# Patient Record
Sex: Female | Born: 2000 | Race: Black or African American | Hispanic: No | Marital: Single | Smoking: Never smoker
Health system: Southern US, Community
[De-identification: ages and names within clinical notes are randomized; demographics above are authoritative.]

## PROBLEM LIST (undated history)

## (undated) ENCOUNTER — Ambulatory Visit

## (undated) ENCOUNTER — Inpatient Hospital Stay (HOSPITAL_COMMUNITY): Payer: Self-pay

## (undated) DIAGNOSIS — A749 Chlamydial infection, unspecified: Secondary | ICD-10-CM

## (undated) HISTORY — PX: NO PAST SURGERIES: SHX2092

---
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
## 2022-01-01 DIAGNOSIS — Z3046 Encounter for surveillance of implantable subdermal contraceptive: Secondary | ICD-10-CM | POA: Diagnosis not present

## 2022-03-15 ENCOUNTER — Ambulatory Visit (HOSPITAL_COMMUNITY)
Admission: EM | Admit: 2022-03-15 | Discharge: 2022-03-15 | Disposition: A | Discharge status: Home / Self Care | Payer: Medicaid Other | Attending: Emergency Medicine | Admitting: Emergency Medicine

## 2022-03-15 ENCOUNTER — Encounter (HOSPITAL_COMMUNITY): Payer: Self-pay

## 2022-03-15 DIAGNOSIS — N390 Urinary tract infection, site not specified: Secondary | ICD-10-CM | POA: Diagnosis not present

## 2022-03-15 DIAGNOSIS — Z113 Encounter for screening for infections with a predominantly sexual mode of transmission: Secondary | ICD-10-CM | POA: Diagnosis not present

## 2022-03-15 DIAGNOSIS — N898 Other specified noninflammatory disorders of vagina: Secondary | ICD-10-CM | POA: Diagnosis not present

## 2022-03-15 LAB — POCT URINALYSIS DIPSTICK, ED / UC
Bilirubin Urine: NEGATIVE
Glucose, UA: NEGATIVE mg/dL
Hgb urine dipstick: NEGATIVE
Nitrite: POSITIVE — AB
Protein, ur: NEGATIVE mg/dL
Specific Gravity, Urine: 1.025 (ref 1.005–1.030)
Urobilinogen, UA: 1 mg/dL (ref 0.0–1.0)
pH: 6.5 (ref 5.0–8.0)

## 2022-03-15 LAB — POC URINE PREG, ED: Preg Test, Ur: NEGATIVE

## 2022-03-15 MED ORDER — CIPROFLOXACIN HCL 250 MG PO TABS: 250.0000 mg | ORAL_TABLET | Freq: Two times a day (BID) | ORAL | 0 refills | Status: AC

## 2022-03-15 MED ORDER — CIPROFLOXACIN HCL 250 MG PO TABS: 250.0000 mg | ORAL_TABLET | Freq: Two times a day (BID) | ORAL | 0 refills | Status: DC

## 2022-03-15 NOTE — ED Provider Notes (Signed)
[facility]    CSN: [id number] Arrival date & time: 03/15/22  1639    HISTORY   Chief Complaint  Patient presents with   Vaginal Discharge   Dysuria   HPI Caitlin King is a pleasant, 21 y.o. female who presents to urgent care today. Patient having tingling sensation when she urinates. Patient having yellow vaginal discharge.  Patient denies lower back pain, flank pain, abdominal pain, fever, aches, chills, known STD exposure but endorses having unprotected sex 2 days ago.  The history is provided by the patient.   Past Medical History:  Diagnosis Date   Hx of chlamydia infection 01/2020   Hx of ectopic pregnancy 01/2020   Hx of gonorrhea 01/2020   Patient Active Problem List   Diagnosis Date Noted   S/P C-section 12/20/2020   Impacted cerumen of left ear 10/04/2020   Gonorrhea affecting pregnancy 05/18/2020   Chlamydia infection during pregnancy 05/18/2020   Ectopic pregnancy of left ovary 02/16/2020   Bleeding in early pregnancy 02/16/2020   Past Surgical History:  Procedure Laterality Date   CESAREAN SECTION N/A 12/20/2020   Procedure: CESAREAN SECTION;  Surgeon: Rande Brunt, MD;  Location: [facility];  Service: Obstetrics;  Laterality: N/A;   OB History     Gravida  2   Para  1   Term  1   Preterm      AB  1   Living  1      SAB      IAB      Ectopic  1   Multiple  0   Live Births  1        Obstetric Comments  G1= ectopic 01/2020, MTX, didn't f/u        Home Medications    Prior to Admission medications   Not on File    Family History Family History  Problem Relation Age of Onset   Healthy Mother    Healthy Father    Asthma Brother    Alcohol abuse Neg Hx    Arthritis Neg Hx    Birth defects Neg Hx    Cancer Neg Hx    Depression Neg Hx    COPD Neg Hx    Diabetes Neg Hx    Drug abuse Neg Hx    Early death Neg Hx    Hearing loss Neg Hx    Heart disease Neg Hx    Hyperlipidemia Neg Hx     Hypertension Neg Hx    Learning disabilities Neg Hx    Kidney disease Neg Hx    Mental illness Neg Hx    Mental retardation Neg Hx    Miscarriages / Stillbirths Neg Hx    Stroke Neg Hx    Vision loss Neg Hx    Varicose Veins Neg Hx    Social History Social History   Tobacco Use   Smoking status: Never   Smokeless tobacco: Never  Vaping Use   Vaping Use: Never used  Substance Use Topics   Alcohol use: Not Currently   Drug use: Never   Allergies   Patient has no known allergies.  Review of Systems Review of Systems Pertinent findings revealed after performing a 14 point review of systems has been noted in the history of present illness.  Physical Exam Triage Vital Signs ED Triage Vitals  Enc Vitals Group     BP 05/24/21 0827 (!) 147/82     Pulse Rate 05/24/21 0827 72  Resp 05/24/21 0827 18     Temp 05/24/21 0827 98.3 F (36.8 C)     Temp Source 05/24/21 0827 Oral     SpO2 05/24/21 0827 98 %     Weight --      Height --      Head Circumference --      Peak Flow --      Pain Score 05/24/21 0826 5     Pain Loc --      Pain Edu? --      Excl. in GC? --   No data found.  Updated Vital Signs BP 106/65 (BP Location: Right Arm)   Pulse 77   Temp 98.2 F (36.8 C) (Oral)   Resp 16   Ht 5\' 5"  (1.651 m)   Wt 147 lb (66.7 kg)   LMP 02/25/2022 (Exact Date)   SpO2 100%   Breastfeeding No   BMI 24.46 kg/m   Physical Exam Vitals and nursing note reviewed.  Constitutional:      General: She is not in acute distress.    Appearance: Normal appearance. She is not ill-appearing.  HENT:     Head: Normocephalic and atraumatic.  Eyes:     General: Lids are normal.        Right eye: No discharge.        Left eye: No discharge.     Extraocular Movements: Extraocular movements intact.     Conjunctiva/sclera: Conjunctivae normal.     Right eye: Right conjunctiva is not injected.     Left eye: Left conjunctiva is not injected.  Neck:     Trachea: Trachea and  phonation normal.  Cardiovascular:     Rate and Rhythm: Normal rate and regular rhythm.     Pulses: Normal pulses.     Heart sounds: Normal heart sounds. No murmur heard.    No friction rub. No gallop.  Pulmonary:     Effort: Pulmonary effort is normal. No accessory muscle usage, prolonged expiration or respiratory distress.     Breath sounds: Normal breath sounds. No stridor, decreased air movement or transmitted upper airway sounds. No decreased breath sounds, wheezing, rhonchi or rales.  Chest:     Chest wall: No tenderness.  Abdominal:     General: Abdomen is flat. Bowel sounds are normal.     Palpations: Abdomen is soft.  Genitourinary:    Comments: Patient politely declines pelvic exam today, patient provided a vaginal swab for testing. Musculoskeletal:        General: Normal range of motion.     Cervical back: Normal range of motion and neck supple. Normal range of motion.  Lymphadenopathy:     Cervical: No cervical adenopathy.  Skin:    General: Skin is warm and dry.     Findings: No erythema or rash.  Neurological:     General: No focal deficit present.     Mental Status: She is alert and oriented to person, place, and time.  Psychiatric:        Mood and Affect: Mood normal.        Behavior: Behavior normal.     Visual Acuity Right Eye Distance:   Left Eye Distance:   Bilateral Distance:    Right Eye Near:   Left Eye Near:    Bilateral Near:     UC Couse / Diagnostics / Procedures:     Radiology No results found.  Procedures Procedures (including critical care time) EKG  Pending results:  Labs  Reviewed  POCT URINALYSIS DIPSTICK, ED / UC - Abnormal; Notable for the following components:      Result Value   Ketones, ur TRACE (*)    Nitrite POSITIVE (*)    Leukocytes,Ua MODERATE (*)    All other components within normal limits  URINE CULTURE  POCT URINE PREGNANCY  POC URINE PREG, ED  CERVICOVAGINAL ANCILLARY ONLY    Medications Ordered in  UC: Medications - No data to display  UC Diagnoses / Final Clinical Impressions(s)   I have reviewed the triage vital signs and the nursing notes.  Pertinent labs & imaging results that were available during my care of the patient were reviewed by me and considered in my medical decision making (see chart for details).    Final diagnoses:  Lower urinary tract infectious disease  Problematic vaginal discharge  Screening examination for STD (sexually transmitted disease)    STD screening was performed, patient advised that the results be posted to their MyChart and if any of the results are positive, they will be notified by phone, further treatment will be provided as indicated based on results of STD screening. Patient was advised to abstain from sexual intercourse until that they receive the results of their STD testing.  Patient was also advised to use condoms to protect themselves from STD exposure. Urine pregnancy test was negative. Return precautions advised.  Drug allergies reviewed, all questions addressed.   Patient was advised to begin antibiotics now due to findings on urine dip. Patient advised that they will be contacted with results and that adjustments to treatment will be provided as indicated based on the results.   Patient was advised of possibility that urine culture results may be negative if sample provided was obtained late in the day causing urine to be more diluted.  Patient was advised that if antibiotics were effective after the first 24 to 36 hours, despite negative urine culture result, it is recommended that they complete the full course as prescribed.   Return precautions advised.  ED Prescriptions     Medication Sig Dispense Auth. Provider   ciprofloxacin (CIPRO) 250 MG tablet  (Status: Discontinued) Take 1 tablet (250 mg total) by mouth 2 (two) times daily for 5 days. 10 tablet Theadora Rama Scales, PA-C   ciprofloxacin (CIPRO) 250 MG tablet Take 1 tablet  (250 mg total) by mouth 2 (two) times daily for 5 days. 10 tablet Theadora Rama Scales, PA-C      PDMP not reviewed this encounter.  Disposition Upon Discharge:  Condition: stable for discharge home  Patient presented with concern for an acute illness with associated systemic symptoms and significant discomfort requiring urgent management. In my opinion, this is a condition that a prudent lay person (someone who possesses an average knowledge of health and medicine) may potentially expect to result in complications if not addressed urgently such as respiratory distress, impairment of bodily function or dysfunction of bodily organs.   As such, the patient has been evaluated and assessed, work-up was performed and treatment was provided in alignment with urgent care protocols and evidence based medicine.  Patient/parent/caregiver has been advised that the patient may require follow up for further testing and/or treatment if the symptoms continue in spite of treatment, as clinically indicated and appropriate.  Routine symptom specific, illness specific and/or disease specific instructions were discussed with the patient and/or caregiver at length.  Prevention strategies for avoiding STD exposure were also discussed.  The patient will follow up with their current  PCP if and as advised. If the patient does not currently have a PCP we will assist them in obtaining one.   The patient may need specialty follow up if the symptoms continue, in spite of conservative treatment and management, for further workup, evaluation, consultation and treatment as clinically indicated and appropriate.  Patient/parent/caregiver verbalized understanding and agreement of plan as discussed.  All questions were addressed during visit.  Please see discharge instructions below for further details of plan.  Discharge Instructions:   Discharge Instructions      The urinalysis that we performed in the clinic today was  abnormal.  Urine culture will be performed per our protocol.  The result of the urine culture will be available in the next 3 to 5 days and will be posted to your MyChart account.  If there is an abnormal finding, you will be contacted by phone and advised of further treatment recommendations, if any.   You were advised to begin antibiotics today because your urinalysis is concerning for an acute lower urinary tract infection also known as cystitis.  It is very important that you take all doses exactly as prescribed.  Incomplete antibiotic therapy can cause worsening urinary tract infection that can become aggressive, escape from urinary tract into your bloodstream causing sepsis which will require hospitalization.   Please pick up and begin taking your prescription for Ciprofloxacin 250 mg as soon as possible.  Please take all doses exactly as prescribed.  You can take this medication with or without food.  This medication is safe to take with your other medications.   The results of your vaginal swab test which screens for BV, yeast, gonorrhea, chlamydia and trichomonas will be made available to you once it is complete.  This typically takes 3 to 5 days.  Please note that we do not test for herpes virus unless you are having an active lesion concerning for herpes outbreak.  Please abstain from sexual intercourse of any kind, vaginal, oral or anal, until you have received the results of your STD testing.     Your urine pregnancy test today is negative.   If you have not had complete resolution of your symptoms after completing treatment, please return for repeat evaluation.   Thank you for visiting urgent care today.  I appreciate the opportunity to participate in your care.        This office note has been dictated using Teaching laboratory technician.  Unfortunately, this method of dictation can sometimes lead to typographical or grammatical errors.  I apologize for your inconvenience in  advance if this occurs.  Please do not hesitate to reach out to me if clarification is needed.       Theadora Rama Scales, [place] 03/15/22 [phone]

## 2022-03-15 NOTE — ED Triage Notes (Signed)
Patient having tingling sensation when she urinates. Patient having yellow vaginal discharge.  No low back or abdominal pain.

## 2022-03-15 NOTE — Discharge Instructions (Addendum)
The urinalysis that we performed in the clinic today was abnormal.  Urine culture will be performed per our protocol.  The result of the urine culture will be available in the next 3 to 5 days and will be posted to your MyChart account.  If there is an abnormal finding, you will be contacted by phone and advised of further treatment recommendations, if any.   You were advised to begin antibiotics today because your urinalysis is concerning for an acute lower urinary tract infection also known as cystitis.  It is very important that you take all doses exactly as prescribed.  Incomplete antibiotic therapy can cause worsening urinary tract infection that can become aggressive, escape from urinary tract into your bloodstream causing sepsis which will require hospitalization.   Please pick up and begin taking your prescription for Ciprofloxacin 250 mg as soon as possible.  Please take all doses exactly as prescribed.  You can take this medication with or without food.  This medication is safe to take with your other medications.   The results of your vaginal swab test which screens for BV, yeast, gonorrhea, chlamydia and trichomonas will be made available to you once it is complete.  This typically takes 3 to 5 days.  Please note that we do not test for herpes virus unless you are having an active lesion concerning for herpes outbreak.  Please abstain from sexual intercourse of any kind, vaginal, oral or anal, until you have received the results of your STD testing.     Your urine pregnancy test today is negative.   If you have not had complete resolution of your symptoms after completing treatment, please return for repeat evaluation.   Thank you for visiting urgent care today.  I appreciate the opportunity to participate in your care.

## 2022-03-17 ENCOUNTER — Telehealth: Payer: Medicaid Other | Admitting: Physician Assistant

## 2022-03-17 DIAGNOSIS — N898 Other specified noninflammatory disorders of vagina: Secondary | ICD-10-CM

## 2022-03-17 LAB — CERVICOVAGINAL ANCILLARY ONLY
Bacterial Vaginitis (gardnerella): POSITIVE — AB
Candida Glabrata: NEGATIVE
Candida Vaginitis: POSITIVE — AB
Chlamydia: POSITIVE — AB
Comment: NEGATIVE
Comment: NEGATIVE
Comment: NEGATIVE
Comment: NEGATIVE
Comment: NEGATIVE
Comment: NORMAL
Neisseria Gonorrhea: POSITIVE — AB
Trichomonas: POSITIVE — AB

## 2022-03-17 LAB — URINE CULTURE: Culture: 70000 — AB

## 2022-03-17 NOTE — Progress Notes (Signed)
Patient never responded to question asked prior to end of shift.

## 2022-03-18 ENCOUNTER — Telehealth (HOSPITAL_COMMUNITY): Payer: Self-pay | Admitting: Emergency Medicine

## 2022-03-18 MED ORDER — DOXYCYCLINE HYCLATE 100 MG PO CAPS: 100.0000 mg | ORAL_CAPSULE | Freq: Two times a day (BID) | ORAL | 0 refills | Status: AC

## 2022-03-18 MED ORDER — METRONIDAZOLE 500 MG PO TABS: 500.0000 mg | ORAL_TABLET | Freq: Two times a day (BID) | ORAL | 0 refills | Status: DC

## 2022-03-18 MED ORDER — FLUCONAZOLE 150 MG PO TABS: 150.0000 mg | ORAL_TABLET | Freq: Once | ORAL | 0 refills | Status: AC

## 2022-06-04 ENCOUNTER — Ambulatory Visit
Admission: EM | Admit: 2022-06-04 | Discharge: 2022-06-04 | Disposition: A | Discharge status: Home / Self Care | Payer: Medicaid Other | Attending: Emergency Medicine | Admitting: Emergency Medicine

## 2022-06-04 DIAGNOSIS — J069 Acute upper respiratory infection, unspecified: Secondary | ICD-10-CM | POA: Diagnosis not present

## 2022-06-04 DIAGNOSIS — B9789 Other viral agents as the cause of diseases classified elsewhere: Secondary | ICD-10-CM

## 2022-06-04 DIAGNOSIS — J988 Other specified respiratory disorders: Secondary | ICD-10-CM | POA: Diagnosis not present

## 2022-06-04 DIAGNOSIS — Z1152 Encounter for screening for COVID-19: Secondary | ICD-10-CM | POA: Diagnosis not present

## 2022-06-04 DIAGNOSIS — Z113 Encounter for screening for infections with a predominantly sexual mode of transmission: Secondary | ICD-10-CM

## 2022-06-04 LAB — RESP PANEL BY RT-PCR (FLU A&B, COVID) ARPGX2
Influenza A by PCR: NEGATIVE
Influenza B by PCR: NEGATIVE
SARS Coronavirus 2 by RT PCR: NEGATIVE

## 2022-06-04 LAB — POCT URINALYSIS DIP (MANUAL ENTRY)
Bilirubin, UA: NEGATIVE
Glucose, UA: NEGATIVE mg/dL
Leukocytes, UA: NEGATIVE
Nitrite, UA: NEGATIVE
Protein Ur, POC: NEGATIVE mg/dL
Spec Grav, UA: >=1.03 — AB (ref 1.010–1.025)
Urobilinogen, UA: 0.2 E.U./dL
pH, UA: 6 (ref 5.0–8.0)

## 2022-06-04 LAB — POCT URINE PREGNANCY
Preg Test, Ur: NEGATIVE
Preg Test, Ur: NEGATIVE

## 2022-06-04 MED ORDER — IBUPROFEN 400 MG PO TABS: 400.0000 mg | ORAL_TABLET | Freq: Three times a day (TID) | ORAL | 0 refills | Status: DC | PRN

## 2022-06-04 MED ORDER — PROMETHAZINE-DM 6.25-15 MG/5ML PO SYRP: 5.0000 mL | ORAL_SOLUTION | Freq: Four times a day (QID) | ORAL | 0 refills | Status: DC | PRN

## 2022-06-04 MED ORDER — IPRATROPIUM BROMIDE 0.06 % NA SOLN: 2.0000 | Freq: Three times a day (TID) | NASAL | 1 refills | Status: DC

## 2022-06-04 MED ORDER — GUAIFENESIN 400 MG PO TABS: ORAL_TABLET | ORAL | 0 refills | Status: AC

## 2022-06-04 NOTE — Discharge Instructions (Addendum)
The results of your vaginal swab test which screens for HIV, syphilis, BV, yeast, gonorrhea, chlamydia and trichomonas will be made posted to your MyChart account once it is complete.  This typically takes 2 to 4 days.  Please abstain from sexual intercourse of any kind, vaginal, oral or anal, until you have received the results of your STD testing.     If any of your results are abnormal, you will receive a phone call regarding treatment.  Prescriptions, if any are needed, will be provided for you at your pharmacy.     Your urine pregnancy test today is negative.   Our point-of-care analysis of your urine sample today was normal and did not reveal any concern for urinary tract infection.   Please read below to learn more about the medications, dosages and frequencies that I recommend to help alleviate your symptoms and to get you feeling better soon:   Atrovent (ipratropium): This is an excellent nasal decongestant spray that does not cause rebound congestion, please instill 2 sprays into each nare with each use.  Please use the spray up to 4 times daily as needed.  I have provided you with a prescription for this medication.      Advil, Motrin (ibuprofen): This is a good anti-inflammatory medication which not only addresses aches, pains but also significantly reduces soft tissue inflammation of the upper airways that causes sinus and nasal congestion as well as inflammation of the lower airways which makes you feel like your breathing is constricted or your cough feel tight.  I recommend that you take 400 mg every 8 hours as needed.      Robitussin, Mucinex (guaifenesin): This is an expectorant.  This helps break up chest congestion and loosen up thick nasal drainage making phlegm and drainage more liquid and therefore easier to remove.  I recommend being 400 mg three times daily as needed.      Promethazine DM: Promethazine is both a nasal decongestant and an antinausea medication that makes most  patients feel fairly sleepy.  The DM is dextromethorphan, a cough suppressant found in many over-the-counter cough medications.  Please take 5 mL before bedtime to minimize your cough which will help you sleep better.  I have sent a prescription for this medication to your pharmacy.   Please follow-up within the next 5-7 days either with your primary care provider or urgent care if your symptoms do not resolve.  If you do not have a primary care provider, we will assist you in finding one.        Thank you for visiting urgent care today.  We appreciate the opportunity to participate in your care.

## 2022-06-04 NOTE — ED Triage Notes (Addendum)
Cough that started 2 days ago with a headache and nasal congestion. Has lower back and abdomen pain. Not taking any OTC medication. Would like STD and UTI check.

## 2022-06-04 NOTE — ED Provider Notes (Signed)
[facility]    CSN: [id number] Arrival date & time: 06/04/22  1456    HISTORY   Chief Complaint  Patient presents with   Cough   Headache   Back Pain    Lower back   HPI Caitlin King is a pleasant, 21 y.o. female who presents to urgent care today. Patient complains of a 2-day history of cough with headache and nasal congestion.  Patient also complains of lower back and abdominal pain.  Patient states has not tried any medications to alleviate her symptoms.  Patient tested positive for chlamydia, gonorrhea, trichomonas, BV and yeast in August 2023, urine culture at that time was also positive for 70 CFU's of Klebsiella.  Patient is also requesting screening for STDs and urinary tract infection.    The history is provided by the patient.   Past Medical History:  Diagnosis Date   Hx of chlamydia infection 01/2020   Hx of ectopic pregnancy 01/2020   Hx of gonorrhea 01/2020   Patient Active Problem List   Diagnosis Date Noted   S/P C-section 12/20/2020   Impacted cerumen of left ear 10/04/2020   Gonorrhea affecting pregnancy 05/18/2020   Chlamydia infection during pregnancy 05/18/2020   Ectopic pregnancy of left ovary 02/16/2020   Bleeding in early pregnancy 02/16/2020   Past Surgical History:  Procedure Laterality Date   CESAREAN SECTION N/A 12/20/2020   Procedure: CESAREAN SECTION;  Surgeon: Jonelle Sidle, MD;  Location: [facility];  Service: Obstetrics;  Laterality: N/A;   OB History     Gravida  2   Para  1   Term  1   Preterm      AB  1   Living  1      SAB      IAB      Ectopic  1   Multiple  0   Live Births  1        Obstetric Comments  G1= ectopic 01/2020, MTX, didn't f/u        Home Medications    Prior to Admission medications   Not on File    Family History Family History  Problem Relation Age of Onset   Healthy Mother    Healthy Father    Asthma Brother    Alcohol abuse Neg Hx    Arthritis Neg Hx     Birth defects Neg Hx    Cancer Neg Hx    Depression Neg Hx    COPD Neg Hx    Diabetes Neg Hx    Drug abuse Neg Hx    Early death Neg Hx    Hearing loss Neg Hx    Heart disease Neg Hx    Hyperlipidemia Neg Hx    Hypertension Neg Hx    Learning disabilities Neg Hx    Kidney disease Neg Hx    Mental illness Neg Hx    Mental retardation Neg Hx    Miscarriages / Stillbirths Neg Hx    Stroke Neg Hx    Vision loss Neg Hx    Varicose Veins Neg Hx    Social History Social History   Tobacco Use   Smoking status: Never   Smokeless tobacco: Never  Vaping Use   Vaping Use: Never used  Substance Use Topics   Alcohol use: Not Currently   Drug use: Never   Allergies   Patient has no known allergies.  Review of Systems Review of Systems Pertinent findings revealed after performing  a 14 point review of systems has been noted in the history of present illness.  Physical Exam Triage Vital Signs ED Triage Vitals  Enc Vitals Group     BP 05/24/21 0827 (!) 147/82     Pulse Rate 05/24/21 0827 72     Resp 05/24/21 0827 18     Temp 05/24/21 0827 98.3 F (36.8 C)     Temp Source 05/24/21 0827 Oral     SpO2 05/24/21 0827 98 %     Weight --      Height --      Head Circumference --      Peak Flow --      Pain Score 05/24/21 0826 5     Pain Loc --      Pain Edu? --      Excl. in [place]? --   No data found.  Updated Vital Signs BP 121/76 (BP Location: Right Arm)   Pulse (!) 106   Temp 98.1 F (36.7 C) (Oral)   Resp 16   LMP 05/31/2022 Comment: very light  SpO2 96%   Physical Exam Vitals and nursing note reviewed.  Constitutional:      General: She is not in acute distress.    Appearance: Normal appearance. She is ill-appearing.  HENT:     Head: Normocephalic and atraumatic.     Salivary Glands: Right salivary gland is not diffusely enlarged or tender. Left salivary gland is not diffusely enlarged or tender.     Right Ear: Tympanic membrane, ear canal and external ear normal.  No drainage. No middle ear effusion. There is no impacted cerumen. Tympanic membrane is not erythematous or bulging.     Left Ear: Tympanic membrane, ear canal and external ear normal. No drainage.  No middle ear effusion. There is no impacted cerumen. Tympanic membrane is not erythematous or bulging.     Nose: Congestion and rhinorrhea present. No nasal deformity, septal deviation or mucosal edema. Rhinorrhea is clear.     Right Turbinates: Not enlarged, swollen or pale.     Left Turbinates: Not enlarged, swollen or pale.     Right Sinus: No maxillary sinus tenderness or frontal sinus tenderness.     Left Sinus: No maxillary sinus tenderness or frontal sinus tenderness.     Mouth/Throat:     Lips: Pink. No lesions.     Mouth: Mucous membranes are moist. No oral lesions.     Pharynx: Uvula midline. Pharyngeal swelling, posterior oropharyngeal erythema and uvula swelling present.     Tonsils: No tonsillar exudate. 0 on the right. 0 on the left.  Eyes:     General: Lids are normal.        Right eye: No discharge.        Left eye: No discharge.     Extraocular Movements: Extraocular movements intact.     Conjunctiva/sclera: Conjunctivae normal.     Right eye: Right conjunctiva is not injected.     Left eye: Left conjunctiva is not injected.  Neck:     Trachea: Trachea and phonation normal.  Cardiovascular:     Rate and Rhythm: Normal rate and regular rhythm.     Pulses: Normal pulses.     Heart sounds: Normal heart sounds. No murmur heard.    No friction rub. No gallop.  Pulmonary:     Effort: Pulmonary effort is normal. No accessory muscle usage, prolonged expiration or respiratory distress.     Breath sounds: Normal breath sounds. No stridor,  decreased air movement or transmitted upper airway sounds. No decreased breath sounds, wheezing, rhonchi or rales.     Comments: Turbulent breath sounds throughout without wheeze, rale, rhonchi. Chest:     Chest wall: No tenderness.  Abdominal:      General: Abdomen is flat. Bowel sounds are normal.     Palpations: Abdomen is soft.  Genitourinary:    Comments: Patient politely declines pelvic exam today, patient provided a vaginal swab for testing. Musculoskeletal:        General: Normal range of motion.     Cervical back: Normal range of motion and neck supple. Normal range of motion.  Lymphadenopathy:     Cervical: Cervical adenopathy present.     Right cervical: Superficial cervical adenopathy and posterior cervical adenopathy present.     Left cervical: Superficial cervical adenopathy and posterior cervical adenopathy present.  Skin:    General: Skin is warm and dry.     Findings: No erythema or rash.  Neurological:     General: No focal deficit present.     Mental Status: She is alert and oriented to person, place, and time.     Motor: Motor function is intact.     Coordination: Coordination is intact.     Gait: Gait is intact.     Deep Tendon Reflexes: Reflexes are normal and symmetric.  Psychiatric:        Attention and Perception: Attention and perception normal.        Mood and Affect: Mood and affect normal.        Speech: Speech normal.        Behavior: Behavior normal. Behavior is cooperative.        Thought Content: Thought content normal.     Visual Acuity Right Eye Distance:   Left Eye Distance:   Bilateral Distance:    Right Eye Near:   Left Eye Near:    Bilateral Near:     [facility] / Diagnostics / Procedures:     Radiology No results found.  Procedures Procedures (including critical care time) EKG  Pending results:  Labs Reviewed  POCT URINALYSIS DIP (MANUAL ENTRY) - Abnormal; Notable for the following components:      Result Value   Ketones, POC UA trace (5) (*)    Spec Grav, UA >=1.030 (*)    Blood, UA large (*)    All other components within normal limits  RESP PANEL BY RT-PCR (FLU A&B, COVID) ARPGX2  RPR  HIV ANTIBODY (ROUTINE TESTING W REFLEX)  POCT URINE PREGNANCY   CERVICOVAGINAL ANCILLARY ONLY    Medications Ordered in UC: Medications - No data to display  UC Diagnoses / Final Clinical Impressions(s)   I have reviewed the triage vital signs and the nursing notes.  Pertinent labs & imaging results that were available during my care of the patient were reviewed by me and considered in my medical decision making (see chart for details).    Final diagnoses:  Screening examination for STD (sexually transmitted disease)  Acute upper respiratory infection  Viral respiratory infection    STD screening was performed, patient advised that the results be posted to their MyChart and if any of the results are positive, they will be notified by phone, further treatment will be provided as indicated based on results of STD screening. Patient was advised to abstain from sexual intercourse until that they receive the results of their STD testing.  Patient was also advised to use condoms to protect themselves  from STD exposure. Urinalysis today was normal. Urine pregnancy test was negative.  Rapid flu test today is negative.  Patient reports negative home COVID-19 test this morning.  Patient was provided with Atrovent nasal spray to dry up nasal secretions to reduce postnasal drip which is the likely source of her cough at this time.  Patient provided with Promethazine DM for nighttime cough and Robitussin for daytime cough.  Patient advised to take ibuprofen 400 mg 3 times daily for inflammation.  COVID-19 and influenza PCR tests pending, results to be posted to patient's MyChart, due to patient's age and lack of comorbidities, would not qualify for Paxlovid.  Due to duration of symptoms, would not benefit from Tamiflu.  ED Prescriptions     Medication Sig Dispense Auth. Provider   ipratropium (ATROVENT) 0.06 % nasal spray Place 2 sprays into both nostrils 3 (three) times daily. As needed for nasal congestion, runny nose 15 mL Lynden Oxford Scales, PA-C    guaifenesin (HUMIBID E) 400 MG TABS tablet Take 1 tablet 3 times daily as needed for chest congestion and cough 21 tablet Lynden Oxford Scales, PA-C   promethazine-dextromethorphan (PROMETHAZINE-DM) 6.25-15 MG/5ML syrup Take 5 mLs by mouth 4 (four) times daily as needed for cough. 118 mL Lynden Oxford Scales, PA-C   ibuprofen (ADVIL) 400 MG tablet Take 1 tablet (400 mg total) by mouth every 8 (eight) hours as needed for up to 30 doses. 30 tablet Lynden Oxford Scales, PA-C      PDMP not reviewed this encounter.  Disposition Upon Discharge:  Condition: stable for discharge home  Patient presented with concern for an acute illness with associated systemic symptoms and significant discomfort requiring urgent management. In my opinion, this is a condition that a prudent lay person (someone who possesses an average knowledge of health and medicine) may potentially expect to result in complications if not addressed urgently such as respiratory distress, impairment of bodily function or dysfunction of bodily organs.   As such, the patient has been evaluated and assessed, work-up was performed and treatment was provided in alignment with urgent care protocols and evidence based medicine.  Patient/parent/caregiver has been advised that the patient may require follow up for further testing and/or treatment if the symptoms continue in spite of treatment, as clinically indicated and appropriate.  Routine symptom specific, illness specific and/or disease specific instructions were discussed with the patient and/or caregiver at length.  Prevention strategies for avoiding STD exposure were also discussed.  The patient will follow up with their current PCP if and as advised. If the patient does not currently have a PCP we will assist them in obtaining one.   The patient may need specialty follow up if the symptoms continue, in spite of conservative treatment and management, for further workup, evaluation,  consultation and treatment as clinically indicated and appropriate.  Patient/parent/caregiver verbalized understanding and agreement of plan as discussed.  All questions were addressed during visit.  Please see discharge instructions below for further details of plan.  Discharge Instructions:   Discharge Instructions      The results of your vaginal swab test which screens for HIV, syphilis, BV, yeast, gonorrhea, chlamydia and trichomonas will be made posted to your MyChart account once it is complete.  This typically takes 2 to 4 days.  Please abstain from sexual intercourse of any kind, vaginal, oral or anal, until you have received the results of your STD testing.     If any of your results are abnormal, you will  receive a phone call regarding treatment.  Prescriptions, if any are needed, will be provided for you at your pharmacy.     Your urine pregnancy test today is negative.   Our point-of-care analysis of your urine sample today was normal and did not reveal any concern for urinary tract infection.   Please read below to learn more about the medications, dosages and frequencies that I recommend to help alleviate your symptoms and to get you feeling better soon:   Atrovent (ipratropium): This is an excellent nasal decongestant spray that does not cause rebound congestion, please instill 2 sprays into each nare with each use.  Please use the spray up to 4 times daily as needed.  I have provided you with a prescription for this medication.      Advil, Motrin (ibuprofen): This is a good anti-inflammatory medication which not only addresses aches, pains but also significantly reduces soft tissue inflammation of the upper airways that causes sinus and nasal congestion as well as inflammation of the lower airways which makes you feel like your breathing is constricted or your cough feel tight.  I recommend that you take 400 mg every 8 hours as needed.      Robitussin, Mucinex (guaifenesin):  This is an expectorant.  This helps break up chest congestion and loosen up thick nasal drainage making phlegm and drainage more liquid and therefore easier to remove.  I recommend being 400 mg three times daily as needed.      Promethazine DM: Promethazine is both a nasal decongestant and an antinausea medication that makes most patients feel fairly sleepy.  The DM is dextromethorphan, a cough suppressant found in many over-the-counter cough medications.  Please take 5 mL before bedtime to minimize your cough which will help you sleep better.  I have sent a prescription for this medication to your pharmacy.   Please follow-up within the next 5-7 days either with your primary care provider or urgent care if your symptoms do not resolve.  If you do not have a primary care provider, we will assist you in finding one.        Thank you for visiting urgent care today.  We appreciate the opportunity to participate in your care.         This office note has been dictated using Museum/gallery curator.  Unfortunately, this method of dictation can sometimes lead to typographical or grammatical errors.  I apologize for your inconvenience in advance if this occurs.  Please do not hesitate to reach out to me if clarification is needed.       Lynden Oxford Scales, [place] 06/04/22 [phone]

## 2022-06-05 LAB — CERVICOVAGINAL ANCILLARY ONLY
Bacterial Vaginitis (gardnerella): POSITIVE — AB
Candida Glabrata: NEGATIVE
Candida Vaginitis: NEGATIVE
Chlamydia: POSITIVE — AB
Comment: NEGATIVE
Comment: NEGATIVE
Comment: NEGATIVE
Comment: NEGATIVE
Comment: NEGATIVE
Comment: NORMAL
Neisseria Gonorrhea: NEGATIVE
Trichomonas: NEGATIVE

## 2022-06-05 LAB — HIV ANTIBODY (ROUTINE TESTING W REFLEX): HIV Screen 4th Generation wRfx: NONREACTIVE

## 2022-06-05 LAB — RPR: RPR Ser Ql: NONREACTIVE

## 2022-06-06 ENCOUNTER — Telehealth (HOSPITAL_COMMUNITY): Payer: Self-pay

## 2022-06-06 MED ORDER — METRONIDAZOLE 500 MG PO TABS: 500.0000 mg | ORAL_TABLET | Freq: Two times a day (BID) | ORAL | 0 refills | Status: DC

## 2022-06-06 MED ORDER — DOXYCYCLINE HYCLATE 100 MG PO CAPS: 100.0000 mg | ORAL_CAPSULE | Freq: Two times a day (BID) | ORAL | 0 refills | Status: DC

## 2022-07-12 ENCOUNTER — Encounter: Payer: Self-pay | Admitting: *Deleted

## 2022-07-12 ENCOUNTER — Ambulatory Visit
Admission: EM | Admit: 2022-07-12 | Discharge: 2022-07-12 | Disposition: A | Discharge status: Home / Self Care | Payer: Medicaid Other | Attending: Emergency Medicine | Admitting: Emergency Medicine

## 2022-07-12 ENCOUNTER — Ambulatory Visit (HOSPITAL_COMMUNITY)
Admission: EM | Admit: 2022-07-12 | Discharge: 2022-07-12 | Discharge status: Absent without leave | Payer: Medicaid Other

## 2022-07-12 DIAGNOSIS — Z3202 Encounter for pregnancy test, result negative: Secondary | ICD-10-CM | POA: Insufficient documentation

## 2022-07-12 DIAGNOSIS — Z113 Encounter for screening for infections with a predominantly sexual mode of transmission: Secondary | ICD-10-CM | POA: Diagnosis not present

## 2022-07-12 HISTORY — DX: Chlamydial infection, unspecified: A74.9

## 2022-07-12 LAB — POCT URINE PREGNANCY: Preg Test, Ur: NEGATIVE

## 2022-07-12 NOTE — ED Provider Notes (Signed)
[facility]   [id number] 07/12/22 Arrival Time: 1138   Chief Complaint  Patient presents with   STI Check    SUBJECTIVE:  Caitlin King is a 21 y.o. female who presented to the urgent care for STD check.  She denies a precipitating event, recent sexual encounter or recent antibiotic use.  She is  sexually active with 1 female partner.  Has not tried the OTC medication.  Denies any radiating or worsening symptoms.  She denies fever, chills, nausea, vomiting, abdominal or pelvic pain, urinary symptoms, vaginal itching, vaginal odor, vaginal bleeding, dyspareunia, vaginal rashes or lesions.   Patient's last menstrual period was 06/28/2022 (exact date). Current birth control method: Compliant with BC:  ROS: As per HPI.  All other pertinent ROS negative.     Past Medical History:  Diagnosis Date   Chlamydia    Hx of chlamydia infection 01/2020   Hx of ectopic pregnancy 01/2020   Hx of gonorrhea 01/2020   Past Surgical History:  Procedure Laterality Date   CESAREAN SECTION N/A 12/20/2020   Procedure: CESAREAN SECTION;  Surgeon: Rande Brunt, MD;  Location: [facility];  Service: Obstetrics;  Laterality: N/A;   No Known Allergies No current facility-administered medications on file prior to encounter.   Current Outpatient Medications on File Prior to Encounter  Medication Sig Dispense Refill   doxycycline (VIBRAMYCIN) 100 MG capsule Take 1 capsule (100 mg total) by mouth 2 (two) times daily. 20 capsule 0   metroNIDAZOLE (FLAGYL) 500 MG tablet Take 1 tablet (500 mg total) by mouth 2 (two) times daily. 14 tablet 0   guaifenesin (HUMIBID E) 400 MG TABS tablet Take 1 tablet 3 times daily as needed for chest congestion and cough 21 tablet 0   ibuprofen (ADVIL) 400 MG tablet Take 1 tablet (400 mg total) by mouth every 8 (eight) hours as needed for up to 30 doses. 30 tablet 0   ipratropium (ATROVENT) 0.06 % nasal spray Place 2 sprays into both nostrils 3 (three) times  daily. As needed for nasal congestion, runny nose 15 mL 1   promethazine-dextromethorphan (PROMETHAZINE-DM) 6.25-15 MG/5ML syrup Take 5 mLs by mouth 4 (four) times daily as needed for cough. 118 mL 0    Social History   Socioeconomic History   Marital status: Single    Spouse name: Not on file   Number of children: Not on file   Years of education: Not on file   Highest education level: Not on file  Occupational History   Not on file  Tobacco Use   Smoking status: Never   Smokeless tobacco: Never  Vaping Use   Vaping Use: Never used  Substance and Sexual Activity   Alcohol use: Not Currently   Drug use: Never   Sexual activity: Yes    Birth control/protection: None  Other Topics Concern   Not on file  Social History Narrative   Not on file   Social Determinants of Health   Financial Resource Strain: Not on file  Food Insecurity: Not on file  Transportation Needs: Not on file  Physical Activity: Not on file  Stress: Not on file  Social Connections: Not on file  Intimate Partner Violence: Not on file   Family History  Problem Relation Age of Onset   Healthy Mother    Healthy Father    Asthma Brother    Alcohol abuse Neg Hx    Arthritis Neg Hx    Birth defects Neg Hx  Cancer Neg Hx    Depression Neg Hx    COPD Neg Hx    Diabetes Neg Hx    Drug abuse Neg Hx    Early death Neg Hx    Hearing loss Neg Hx    Heart disease Neg Hx    Hyperlipidemia Neg Hx    Hypertension Neg Hx    Learning disabilities Neg Hx    Kidney disease Neg Hx    Mental illness Neg Hx    Mental retardation Neg Hx    Miscarriages / Stillbirths Neg Hx    Stroke Neg Hx    Vision loss Neg Hx    Varicose Veins Neg Hx     OBJECTIVE:  Vitals:   07/12/22 1318  BP: 106/67  Pulse: 76  Resp: 16  Temp: 98.5 F (36.9 C)  SpO2: 98%     General appearance: Alert, NAD, appears stated age Head: NCAT Throat: lips, mucosa, and tongue normal; teeth and gums normal Lungs: CTA bilaterally  without adventitious breath sounds Heart: regular rate and rhythm.  Radial pulses 2+ symmetrical bilaterally Back: no CVA tenderness Abdomen: soft, non-tender; bowel sounds normal; no masses or organomegaly; no guarding or rebound tenderness GU: Cervical swab obtained Skin: warm and dry Psychological:  Alert and cooperative. Normal mood and affect.  LABS:  Results for orders placed or performed during the hospital encounter of 07/12/22  POCT urine pregnancy  Result Value Ref Range   Preg Test, Ur Negative Negative    Labs Reviewed  POCT URINE PREGNANCY  CERVICOVAGINAL ANCILLARY ONLY    ASSESSMENT & PLAN:  1. Screening for STD (sexually transmitted disease)   2. Negative pregnancy test     No orders of the defined types were placed in this encounter.   Pending: Labs Reviewed  POCT URINE PREGNANCY  CERVICOVAGINAL ANCILLARY ONLY    Vaginal self-swab obtained.  We will follow up with you regarding abnormal results Please abstain from sexual activity until result become available Follow up with PCP or [facility] if symptoms persists Return here or go to ER if you have any new or worsening symptoms fever, chills, nausea, vomiting, abdominal or pelvic pain, painful intercourse, vaginal discharge, vaginal bleeding, persistent symptoms despite treatment, etc...  Reviewed expectations re: course of current medical issues. Questions answered. Outlined signs and symptoms indicating need for more acute intervention. Patient verbalized understanding. After Visit Summary given.        Emerson Monte, FNP 07/12/22 1340

## 2022-07-12 NOTE — Discharge Instructions (Addendum)
Vaginal self-swab obtained.  We will follow up with you regarding abnormal results Please abstain from sexual activity until result become available Follow up with PCP or [facility] if symptoms persists Return here or go to ER if you have any new or worsening symptoms fever, chills, nausea, vomiting, abdominal or pelvic pain, painful intercourse, vaginal discharge, vaginal bleeding, persistent symptoms despite treatment, etc..Marland Kitchen

## 2022-07-12 NOTE — ED Triage Notes (Addendum)
Pt requesting STI Check, denies any sxs. States completed the courses of doxycycline and Flagyl that were prescribed to treat BV and chlamydia this past month. Pt also reporting that she keeps getting both negative and positive home pregnancy tests over past month.

## 2022-07-14 LAB — CERVICOVAGINAL ANCILLARY ONLY
Bacterial Vaginitis (gardnerella): POSITIVE — AB
Candida Glabrata: NEGATIVE
Candida Vaginitis: NEGATIVE
Chlamydia: NEGATIVE
Comment: NEGATIVE
Comment: NEGATIVE
Comment: NEGATIVE
Comment: NEGATIVE
Comment: NEGATIVE
Comment: NORMAL
Neisseria Gonorrhea: POSITIVE — AB
Trichomonas: POSITIVE — AB

## 2022-07-15 ENCOUNTER — Ambulatory Visit
Admission: EM | Admit: 2022-07-15 | Discharge: 2022-07-15 | Disposition: A | Discharge status: Home / Self Care | Payer: Medicaid Other | Attending: Internal Medicine | Admitting: Internal Medicine

## 2022-07-15 ENCOUNTER — Telehealth (HOSPITAL_COMMUNITY): Payer: Self-pay | Admitting: Emergency Medicine

## 2022-07-15 DIAGNOSIS — Z113 Encounter for screening for infections with a predominantly sexual mode of transmission: Secondary | ICD-10-CM

## 2022-07-15 MED ORDER — METRONIDAZOLE 500 MG PO TABS: 500.0000 mg | ORAL_TABLET | Freq: Two times a day (BID) | ORAL | 0 refills | Status: DC

## 2022-07-15 MED ORDER — CEFTRIAXONE SODIUM 500 MG IJ SOLR
500.0000 mg | Freq: Once | INTRAMUSCULAR | Status: AC
  Administered 2022-07-15: 500 mg via INTRAMUSCULAR

## 2022-07-15 NOTE — Telephone Encounter (Signed)
Per protocol, patient will need treatment with IM Rocephin 500mg  for positive Gonorrhea. Will also need treatment with Metronidazole.   Results seen on mychart Attempted to reach patient x 1, [facility] notified Prescription sent to pharmacy on file

## 2022-07-27 ENCOUNTER — Emergency Department (HOSPITAL_COMMUNITY)
Admission: EM | Admit: 2022-07-27 | Discharge: 2022-07-28 | Disposition: A | Discharge status: Home / Self Care | Payer: Medicaid Other | Attending: Emergency Medicine | Admitting: Emergency Medicine

## 2022-07-27 ENCOUNTER — Other Ambulatory Visit: Payer: Self-pay

## 2022-07-27 ENCOUNTER — Emergency Department (HOSPITAL_COMMUNITY): Payer: Medicaid Other

## 2022-07-27 DIAGNOSIS — S6991XA Unspecified injury of right wrist, hand and finger(s), initial encounter: Secondary | ICD-10-CM | POA: Diagnosis not present

## 2022-07-27 DIAGNOSIS — S60221A Contusion of right hand, initial encounter: Secondary | ICD-10-CM | POA: Insufficient documentation

## 2022-07-27 DIAGNOSIS — W228XXA Striking against or struck by other objects, initial encounter: Secondary | ICD-10-CM | POA: Insufficient documentation

## 2022-07-27 MED ORDER — NAPROXEN 500 MG PO TABS
500.0000 mg | ORAL_TABLET | Freq: Once | ORAL | Status: AC
  Administered 2022-07-28: 500 mg via ORAL
  Filled 2022-07-27: qty 1

## 2022-07-27 NOTE — ED Triage Notes (Signed)
Patient coming to ED for evaluation of R hand pain s/p hitting a door.  Reports pain with movement of fingers and hand.  States "I think I might have broken it."

## 2022-07-28 NOTE — ED Provider Notes (Signed)
[facility] DEPT Provider Note   CSN: [id number] Arrival date & time: 07/27/22  2306     History  Chief Complaint  Patient presents with   Hand Pain    Cathline Dowen is a 22 y.o. female.  22 year old female presenting to the emergency department for evaluation of right hand pain.  She reports punching a door prior to arrival with constant pain since time of injury.  She has some swelling to her right hand and is concerned for broken bone as she has (self-reported) history of prior boxer's fracture. No medications PTA for symptoms.   The history is provided by the patient. No language interpreter was used.  Hand Pain       Home Medications Prior to Admission medications   Medication Sig Start Date End Date Taking? Authorizing Provider  doxycycline (VIBRAMYCIN) 100 MG capsule Take 1 capsule (100 mg total) by mouth 2 (two) times daily. 06/06/22   LampteyMyrene Galas, MD  guaifenesin (HUMIBID E) 400 MG TABS tablet Take 1 tablet 3 times daily as needed for chest congestion and cough 06/04/22   Lynden Oxford Scales, PA-C  ibuprofen (ADVIL) 400 MG tablet Take 1 tablet (400 mg total) by mouth every 8 (eight) hours as needed for up to 30 doses. 06/04/22   Lynden Oxford Scales, PA-C  ipratropium (ATROVENT) 0.06 % nasal spray Place 2 sprays into both nostrils 3 (three) times daily. As needed for nasal congestion, runny nose 06/04/22   Lynden Oxford Scales, PA-C  metroNIDAZOLE (FLAGYL) 500 MG tablet Take 1 tablet (500 mg total) by mouth 2 (two) times daily. 07/15/22   Chase Picket, MD  promethazine-dextromethorphan (PROMETHAZINE-DM) 6.25-15 MG/5ML syrup Take 5 mLs by mouth 4 (four) times daily as needed for cough. 06/04/22   Lynden Oxford Scales, PA-C      Allergies    Patient has no known allergies.    Review of Systems   Review of Systems Ten systems reviewed and are negative for acute change, except as noted in the HPI.    Physical  Exam Updated Vital Signs LMP 06/28/2022 (Exact Date)   Physical Exam Vitals and nursing note reviewed.  Constitutional:      General: She is not in acute distress.    Appearance: She is well-developed. She is not diaphoretic.     Comments: Nontoxic-appearing  HENT:     Head: Normocephalic and atraumatic.  Eyes:     General: No scleral icterus.    Conjunctiva/sclera: Conjunctivae normal.  Cardiovascular:     Rate and Rhythm: Normal rate and regular rhythm.     Pulses: Normal pulses.     Comments: Distal radial pulse 2+ in the right upper extremity Pulmonary:     Effort: Pulmonary effort is normal. No respiratory distress.  Musculoskeletal:        General: Normal range of motion.     Right hand: Swelling (mild proximal to 2nd and 3rd MCP joints) and tenderness present. No deformity. Normal range of motion. Normal capillary refill.       Hands:     Cervical back: Normal range of motion.  Skin:    General: Skin is warm and dry.     Coloration: Skin is not pale.     Findings: No erythema or rash.  Neurological:     Mental Status: She is alert and oriented to person, place, and time.     Comments: Patient able to wiggle all fingers and make a closed fist.  Psychiatric:  Behavior: Behavior normal.     ED Results / Procedures / Treatments   Labs (all labs ordered are listed, but only abnormal results are displayed) Labs Reviewed - No data to display  EKG None  Radiology DG Hand Complete Right  Result Date: 07/27/2022 CLINICAL DATA:  Status post trauma. EXAM: RIGHT HAND - COMPLETE 3+ VIEW COMPARISON:  November 14, 2017 FINDINGS: There is no evidence of fracture or dislocation. There is no evidence of arthropathy or other focal bone abnormality. Soft tissues are unremarkable. IMPRESSION: Negative. Electronically Signed   By: Virgina Norfolk M.D.   On: 07/27/2022 23:47    Procedures Procedures    Medications Ordered in ED Medications  naproxen (NAPROSYN) tablet  500 mg (500 mg Oral Given 07/28/22 0004)    ED Course/ Medical Decision Making/ A&P                           Medical Decision Making Amount and/or Complexity of Data Reviewed Radiology: ordered.  Risk Prescription drug management.   This patient presents to the ED for concern of R hand pain, this involves an extensive number of treatment options, and is a complaint that carries with it a high risk of complications and morbidity.  The differential diagnosis includes contusion vs fracture vs dislocation   Co morbidities that complicate the patient evaluation  None    Additional history obtained:  External records from outside source obtained and reviewed including Xray R hand from 2019 notable for 5th metacarpal shaft fx.   Imaging Studies ordered:  I ordered imaging studies including R hand Xray  I independently visualized and interpreted imaging which showed no acute or traumatic pathology I agree with the radiologist interpretation   Cardiac Monitoring:  The patient was maintained on a cardiac monitor.  I personally viewed and interpreted the cardiac monitored which showed an underlying rhythm of: NSR   Medicines ordered and prescription drug management:  I ordered medication including naproxen for pain  Reevaluation of the patient after these medicines showed that the patient stayed the same I have reviewed the patients home medicines and have made adjustments as needed   Problem List / ED Course:  Neurovascularly intact with negative Xray. Clinical evaluation c/w traumatic hand contusion. Brace provided for support/comfort.   Reevaluation:  After the interventions noted above, I reevaluated the patient and found that they have : remained stable   Social Determinants of Health:  Insured patient   Dispostion:  After consideration of the diagnostic results and the patients response to treatment, I feel that the patent would benefit from RICE and NSAIDs.  Encouraged PCP f/u for recheck. Return precautions discussed and provided. Patient discharged in stable condition with no unaddressed concerns.          Final Clinical Impression(s) / ED Diagnoses Final diagnoses:  Contusion of right hand, initial encounter    Rx / DC Orders ED Discharge Orders     None         Antonietta Breach, PA-C 07/28/22 0015    Fatima Blank, MD 07/28/22 [phone]

## 2022-07-28 NOTE — Discharge Instructions (Addendum)
Your x-ray did show a fracture or broken bone in your hand.  We recommend using a wrist brace for comfort as needed.  Take ibuprofen or Tylenol for pain.  Continue icing 2-3 times per day for 15 to 20 minutes each time.  Follow-up with a primary care doctor for recheck.

## 2022-07-29 ENCOUNTER — Ambulatory Visit
Admission: EM | Admit: 2022-07-29 | Discharge: 2022-07-29 | Disposition: A | Discharge status: Home / Self Care | Payer: Medicaid Other | Attending: Family Medicine | Admitting: Family Medicine

## 2022-07-29 DIAGNOSIS — Z3202 Encounter for pregnancy test, result negative: Secondary | ICD-10-CM | POA: Diagnosis not present

## 2022-07-29 DIAGNOSIS — R102 Pelvic and perineal pain: Secondary | ICD-10-CM | POA: Diagnosis not present

## 2022-07-29 LAB — POCT URINE PREGNANCY: Preg Test, Ur: NEGATIVE

## 2022-07-29 MED ORDER — IBUPROFEN 600 MG PO TABS: 600.0000 mg | ORAL_TABLET | Freq: Three times a day (TID) | ORAL | 0 refills | Status: DC | PRN

## 2022-07-29 NOTE — Discharge Instructions (Signed)
The pregnancy test was negative  Take ibuprofen 800 mg--1 tab every 8 hours as needed for pain.

## 2022-07-29 NOTE — ED Provider Notes (Signed)
[facility]    CSN: [id number] Arrival date & time: 07/29/22  1453      History   Chief Complaint Chief Complaint  Patient presents with   Possible Pregnancy    HPI Caitlin King is a 22 y.o. female.    Possible Pregnancy   Here for pelvic pain.  The cramping began about December 29.  Her period came on December 30 and lasted 2 days.  Yesterday she did not have any bleeding and then today she has had some pink spotting.  She did receive STD testing in December on the 16th.  She was positive for gonorrhea and for BV and for trichomoniasis.  She reports getting the Rocephin shot on December 20, though apparently our staff did not enter an encounter today she got it.  She did complete her metronidazole.  She reports no intercourse since she got the treatment.  No fever    Past Medical History:  Diagnosis Date   Chlamydia    Hx of chlamydia infection 01/2020   Hx of ectopic pregnancy 01/2020   Hx of gonorrhea 01/2020    Patient Active Problem List   Diagnosis Date Noted   S/P C-section 12/20/2020   Impacted cerumen of left ear 10/04/2020   Gonorrhea affecting pregnancy 05/18/2020   Chlamydia infection during pregnancy 05/18/2020   Ectopic pregnancy of left ovary 02/16/2020   Bleeding in early pregnancy 02/16/2020    Past Surgical History:  Procedure Laterality Date   CESAREAN SECTION N/A 12/20/2020   Procedure: CESAREAN SECTION;  Surgeon: Rande Brunt, MD;  Location: [facility] ORS;  Service: Obstetrics;  Laterality: N/A;    OB History     Gravida  2   Para  1   Term  1   Preterm      AB  1   Living  1      SAB      IAB      Ectopic  1   Multiple  0   Live Births  1        Obstetric Comments  G1= ectopic 01/2020, MTX, didn't f/u          Home Medications    Prior to Admission medications   Medication Sig Start Date End Date Taking? Authorizing Provider  ibuprofen (ADVIL) 600 MG tablet Take 1 tablet (600 mg total)  by mouth every 8 (eight) hours as needed (pain). 07/29/22  Yes Zenia Resides, MD  guaifenesin (HUMIBID E) 400 MG TABS tablet Take 1 tablet 3 times daily as needed for chest congestion and cough 06/04/22   Theadora Rama Scales, PA-C  ipratropium (ATROVENT) 0.06 % nasal spray Place 2 sprays into both nostrils 3 (three) times daily. As needed for nasal congestion, runny nose 06/04/22   Theadora Rama Scales, PA-C    Family History Family History  Problem Relation Age of Onset   Healthy Mother    Healthy Father    Asthma Brother    Alcohol abuse Neg Hx    Arthritis Neg Hx    Birth defects Neg Hx    Cancer Neg Hx    Depression Neg Hx    COPD Neg Hx    Diabetes Neg Hx    Drug abuse Neg Hx    Early death Neg Hx    Hearing loss Neg Hx    Heart disease Neg Hx    Hyperlipidemia Neg Hx    Hypertension Neg Hx    Learning disabilities Neg  Hx    Kidney disease Neg Hx    Mental illness Neg Hx    Mental retardation Neg Hx    Miscarriages / Stillbirths Neg Hx    Stroke Neg Hx    Vision loss Neg Hx    Varicose Veins Neg Hx     Social History Social History   Tobacco Use   Smoking status: Never   Smokeless tobacco: Never  Vaping Use   Vaping Use: Never used  Substance Use Topics   Alcohol use: Not Currently   Drug use: Never     Allergies   Patient has no known allergies.   Review of Systems Review of Systems   Physical Exam Triage Vital Signs ED Triage Vitals  Enc Vitals Group     BP 07/29/22 1529 114/75     Pulse Rate 07/29/22 1529 73     Resp 07/29/22 1529 16     Temp 07/29/22 1529 98.3 F (36.8 C)     Temp Source 07/29/22 1529 Oral     SpO2 07/29/22 1529 98 %     Weight --      Height --      Head Circumference --      Peak Flow --      Pain Score 07/29/22 1543 4     Pain Loc --      Pain Edu? --      Excl. in [place]? --    No data found.  Updated Vital Signs BP 114/75 (BP Location: Left Arm)   Pulse 73   Temp 98.3 F (36.8 C) (Oral)   Resp 16    LMP 07/26/2022 (Exact Date)   SpO2 98%   Visual Acuity Right Eye Distance:   Left Eye Distance:   Bilateral Distance:    Right Eye Near:   Left Eye Near:    Bilateral Near:     Physical Exam Vitals reviewed.  Constitutional:      General: She is not in acute distress.    Appearance: She is not ill-appearing, toxic-appearing or diaphoretic.  Cardiovascular:     Rate and Rhythm: Normal rate and regular rhythm.     Heart sounds: No murmur heard. Pulmonary:     Effort: Pulmonary effort is normal.     Breath sounds: Normal breath sounds.  Abdominal:     Palpations: Abdomen is soft.     Tenderness: There is abdominal tenderness (suprapubic area).  Neurological:     Mental Status: She is alert and oriented to person, place, and time.  Psychiatric:        Behavior: Behavior normal.      UC Treatments / Results  Labs (all labs ordered are listed, but only abnormal results are displayed) Labs Reviewed  POCT URINE PREGNANCY    EKG   Radiology DG Hand Complete Right  Result Date: 07/27/2022 CLINICAL DATA:  Status post trauma. EXAM: RIGHT HAND - COMPLETE 3+ VIEW COMPARISON:  November 14, 2017 FINDINGS: There is no evidence of fracture or dislocation. There is no evidence of arthropathy or other focal bone abnormality. Soft tissues are unremarkable. IMPRESSION: Negative. Electronically Signed   By: Virgina Norfolk M.D.   On: 07/27/2022 23:47    Procedures Procedures (including critical care time)  Medications Ordered in UC Medications - No data to display  Initial Impression / Assessment and Plan / UC Course  I have reviewed the triage vital signs and the nursing notes.  Pertinent labs & imaging results that were available  during my care of the patient were reviewed by me and considered in my medical decision making (see chart for details).        UPT is negative.  Ibuprofen is sent to treat the menstrual cramps.  No retesting is done as it sounds like she completed  treatment adequately and did not have any intervening intercourse.  She is given contact information for OB/GYN Final Clinical Impressions(s) / UC Diagnoses   Final diagnoses:  Pelvic pain     Discharge Instructions      The pregnancy test was negative  Take ibuprofen 800 mg--1 tab every 8 hours as needed for pain.      ED Prescriptions     Medication Sig Dispense Auth. Provider   ibuprofen (ADVIL) 600 MG tablet Take 1 tablet (600 mg total) by mouth every 8 (eight) hours as needed (pain). 15 tablet Samyukta Cura, Gwenlyn Perking, MD      PDMP not reviewed this encounter.   Barrett Henle, MD 07/29/22 [phone]

## 2022-07-29 NOTE — ED Triage Notes (Signed)
Pt requests pregnancy test-c/o abd cramping today- LMP 07/26/22

## 2022-08-04 ENCOUNTER — Ambulatory Visit
Admission: EM | Admit: 2022-08-04 | Discharge: 2022-08-04 | Disposition: A | Discharge status: Home / Self Care | Payer: Medicaid Other | Attending: Internal Medicine | Admitting: Internal Medicine

## 2022-08-04 DIAGNOSIS — N3001 Acute cystitis with hematuria: Secondary | ICD-10-CM | POA: Diagnosis not present

## 2022-08-04 DIAGNOSIS — Z3202 Encounter for pregnancy test, result negative: Secondary | ICD-10-CM

## 2022-08-04 LAB — POCT URINALYSIS DIP (MANUAL ENTRY)
Bilirubin, UA: NEGATIVE
Glucose, UA: NEGATIVE mg/dL
Ketones, POC UA: NEGATIVE mg/dL
Nitrite, UA: NEGATIVE
Protein Ur, POC: 30 mg/dL — AB
Spec Grav, UA: 1.025 (ref 1.010–1.025)
Urobilinogen, UA: 0.2 E.U./dL
pH, UA: 7 (ref 5.0–8.0)

## 2022-08-04 LAB — POCT URINE PREGNANCY: Preg Test, Ur: NEGATIVE

## 2022-08-04 MED ORDER — CIPROFLOXACIN HCL 500 MG PO TABS: 500.0000 mg | ORAL_TABLET | Freq: Two times a day (BID) | ORAL | 0 refills | Status: DC

## 2022-08-04 MED ORDER — CEPHALEXIN 500 MG PO CAPS: 500.0000 mg | ORAL_CAPSULE | Freq: Four times a day (QID) | ORAL | 0 refills | Status: AC

## 2022-08-04 NOTE — ED Triage Notes (Addendum)
Pt c/o lower abd and lower back pain that has been going on for a week, the patient states she has been having episodes of nausea/ vomiting.   At home pregnancy test was positive yesterday and today.    Home interventions: none

## 2022-08-04 NOTE — Discharge Instructions (Addendum)
The urinalysis that we performed in the clinic today was abnormal.  Urine culture will be performed per our protocol.  The result of the urine culture will be available in the next 3 to 5 days and will be posted to your MyChart account.  If there is an abnormal finding, you will be contacted by phone and advised of further treatment recommendations, if any.   You were advised to begin antibiotics today because you are having active symptoms of an acute upper urinary tract infection also known as pyelonephritis.  It is very important that you take all doses exactly as prescribed.  Incomplete antibiotic therapy can cause worsening urinary tract infection that can become aggressive, escape from urinary tract into your bloodstream causing sepsis which will require hospitalization.   Please pick up and begin taking your prescription for Ciprofloxacin 500 mg as soon as possible.  Please take all doses exactly as prescribed.  You can take this medication with or without food.  This medication is safe to take with your other medications.   If you receive a phone call advising you that your urine culture is negative but you begin to feel better after taking antibiotics for 24 hours, please feel free to complete the full course of antibiotics as they were likely needed and the urine culture result was false.  If your culture is negative and you do not feel any better, please return for repeat evaluation of other possible causes of your symptoms.   Please consider abstaining from sexual intercourse while you are being treated for urinary tract infection.   If you have not had complete resolution of your symptoms after completing treatment as prescribed, please return to urgent care for repeat evaluation or follow-up with your primary care provider.  Repeat urinalysis and urine culture may be indicated for more directed therapy.  Your urine pregnancy test today is negative.   Thank you for visiting urgent care  today.  I appreciate the opportunity to participate in your care.

## 2022-08-04 NOTE — ED Provider Notes (Signed)
[facility]    CSN: [id number] Arrival date & time: 08/04/22  1153    HISTORY   Chief Complaint  Patient presents with   Abdominal Pain    My stomach and back is in pain it's been going on a week and it ain't got better I be sick to my stomach when i wake up and threw out the day even if I smell food my back and bottom of my stomach have a sharp pain - Entered by patient   Back Pain   HPI Caitlin King is a pleasant, 22 y.o. female who presents to urgent care today. Patient complains of lower abdominal pain and lower back pain that has been present for approximately 1 week.  Patient states she is also been having episodes of nausea and vomiting.  Patient reports to "slightly positive" home pregnancy test, pregnancy test during her visit today was negative.  Patient has normal vital signs on arrival today.  Patient denies diarrhea, fever, body aches, chills, sore throat, known sick contacts.  Patient states she has been engaging in sexual intercourse without using any form of birth control.  Urine dip today reveals leukocytes and trace blood as well as cloudy appearance.  The history is provided by the patient.   Past Medical History:  Diagnosis Date   Chlamydia    Hx of chlamydia infection 01/2020   Hx of ectopic pregnancy 01/2020   Hx of gonorrhea 01/2020   Patient Active Problem List   Diagnosis Date Noted   S/P C-section 12/20/2020   Impacted cerumen of left ear 10/04/2020   Gonorrhea affecting pregnancy 05/18/2020   Chlamydia infection during pregnancy 05/18/2020   Ectopic pregnancy of left ovary 02/16/2020   Bleeding in early pregnancy 02/16/2020   Past Surgical History:  Procedure Laterality Date   CESAREAN SECTION N/A 12/20/2020   Procedure: CESAREAN SECTION;  Surgeon: Jonelle Sidle, MD;  Location: [place] [facility];  Service: Obstetrics;  Laterality: N/A;   OB History     Gravida  3   Para  1   Term  1   Preterm      AB  1   Living  1       SAB      IAB      Ectopic  1   Multiple  0   Live Births  1        Obstetric Comments  G1= ectopic 01/2020, MTX, didn't f/u        Home Medications    Prior to Admission medications   Medication Sig Start Date End Date Taking? Authorizing Provider  guaifenesin (HUMIBID E) 400 MG TABS tablet Take 1 tablet 3 times daily as needed for chest congestion and cough 06/04/22   Lynden Oxford Scales, PA-C  ibuprofen (ADVIL) 600 MG tablet Take 1 tablet (600 mg total) by mouth every 8 (eight) hours as needed (pain). 07/29/22   Barrett Henle, MD  ipratropium (ATROVENT) 0.06 % nasal spray Place 2 sprays into both nostrils 3 (three) times daily. As needed for nasal congestion, runny nose 06/04/22   Lynden Oxford Scales, PA-C    Family History Family History  Problem Relation Age of Onset   Healthy Mother    Healthy Father    Asthma Brother    Alcohol abuse Neg Hx    Arthritis Neg Hx    Birth defects Neg Hx    Cancer Neg Hx    Depression Neg Hx    COPD  Neg Hx    Diabetes Neg Hx    Drug abuse Neg Hx    Early death Neg Hx    Hearing loss Neg Hx    Heart disease Neg Hx    Hyperlipidemia Neg Hx    Hypertension Neg Hx    Learning disabilities Neg Hx    Kidney disease Neg Hx    Mental illness Neg Hx    Mental retardation Neg Hx    Miscarriages / Stillbirths Neg Hx    Stroke Neg Hx    Vision loss Neg Hx    Varicose Veins Neg Hx    Social History Social History   Tobacco Use   Smoking status: Never   Smokeless tobacco: Never  Vaping Use   Vaping Use: Never used  Substance Use Topics   Alcohol use: Not Currently   Drug use: Never   Allergies   Patient has no known allergies.  Review of Systems Review of Systems Pertinent findings revealed after performing a 14 point review of systems has been noted in the history of present illness.  Physical Exam Triage Vital Signs ED Triage Vitals  Enc Vitals Group     BP 05/24/21 0827 (!) 147/82     Pulse Rate  05/24/21 0827 72     Resp 05/24/21 0827 18     Temp 05/24/21 0827 98.3 F (36.8 C)     Temp Source 05/24/21 0827 Oral     SpO2 05/24/21 0827 98 %     Weight --      Height --      Head Circumference --      Peak Flow --      Pain Score 05/24/21 0826 5     Pain Loc --      Pain Edu? --      Excl. in GC? --   No data found.  Updated Vital Signs BP 117/71 (BP Location: Left Arm)   Pulse 89   Temp 98.5 F (36.9 C) (Oral)   Resp 16   LMP 07/26/2022 (Exact Date)   SpO2 99%   Breastfeeding Unknown   Physical Exam Vitals and nursing note reviewed.  Constitutional:      General: She is not in acute distress.    Appearance: Normal appearance. She is not ill-appearing.  HENT:     Head: Normocephalic and atraumatic.  Eyes:     General: Lids are normal.        Right eye: No discharge.        Left eye: No discharge.     Extraocular Movements: Extraocular movements intact.     Conjunctiva/sclera: Conjunctivae normal.     Right eye: Right conjunctiva is not injected.     Left eye: Left conjunctiva is not injected.  Neck:     Trachea: Trachea and phonation normal.  Cardiovascular:     Rate and Rhythm: Normal rate and regular rhythm.     Pulses: Normal pulses.     Heart sounds: Normal heart sounds. No murmur heard.    No friction rub. No gallop.  Pulmonary:     Effort: Pulmonary effort is normal. No accessory muscle usage, prolonged expiration or respiratory distress.     Breath sounds: Normal breath sounds. No stridor, decreased air movement or transmitted upper airway sounds. No decreased breath sounds, wheezing, rhonchi or rales.  Chest:     Chest wall: No tenderness.  Abdominal:     General: Abdomen is flat. Bowel sounds are normal. There  is no distension.     Palpations: Abdomen is soft.     Tenderness: There is abdominal tenderness in the suprapubic area. There is no right CVA tenderness or left CVA tenderness.     Hernia: No hernia is present.  Musculoskeletal:         General: Normal range of motion.     Cervical back: Normal range of motion and neck supple. Normal range of motion.  Lymphadenopathy:     Cervical: No cervical adenopathy.  Skin:    General: Skin is warm and dry.     Findings: No erythema or rash.  Neurological:     General: No focal deficit present.     Mental Status: She is alert and oriented to person, place, and time.  Psychiatric:        Mood and Affect: Mood normal.        Behavior: Behavior normal.     Visual Acuity Right Eye Distance:   Left Eye Distance:   Bilateral Distance:    Right Eye Near:   Left Eye Near:    Bilateral Near:     UC Couse / Diagnostics / Procedures:     Radiology No results found.  Procedures Procedures (including critical care time) EKG  Pending results:  Labs Reviewed  POCT URINALYSIS DIP (MANUAL ENTRY) - Abnormal; Notable for the following components:      Result Value   Clarity, UA cloudy (*)    Blood, UA trace-intact (*)    Protein Ur, POC =30 (*)    Leukocytes, UA Small (1+) (*)    All other components within normal limits  POCT URINE PREGNANCY    Medications Ordered in UC: Medications - No data to display  UC Diagnoses / Final Clinical Impressions(s)   I have reviewed the triage vital signs and the nursing notes.  Pertinent labs & imaging results that were available during my care of the patient were reviewed by me and considered in my medical decision making (see chart for details).    Final diagnoses:  Acute cystitis with hematuria   Urine dip today revealed microscopic hematuria and pyuria.  Urine culture will be performed per our protocol.     Patient was advised to begin antibiotics now due to findings on urine dip.   Patient was advised to begin antibiotics today due to having active symptoms of urinary tract infection.                      Patient was advised to take all doses exactly as prescribed.  Patient also advised of risks of worsening infection with  incomplete antibiotic therapy.   Patient advised that they will be contacted with results and that adjustments to treatment will be provided as indicated based on the results.   Patient was advised of possibility that urine culture results may be negative if sample provided was obtained late in the day causing urine to be more diluted.  Patient was advised that if antibiotics were effective after the first 24 to 36 hours, despite negative urine culture result, it is recommended that they complete the full course as prescribed.    Urine pregnancy test today was negative.   Return precautions advised.  Please see discharge instructions below for details of plan of care as provided to patient. ED Prescriptions     Medication Sig Dispense Auth. Provider   ciprofloxacin (CIPRO) 500 MG tablet Take 1 tablet (500 mg total) by mouth every 12 (  twelve) hours for 5 days. 10 tablet Theadora Rama Scales, PA-C      PDMP not reviewed this encounter.  Disposition Upon Discharge:  Condition: stable for discharge home  Patient presented with concern for an acute illness with associated systemic symptoms and significant discomfort requiring urgent management. In my opinion, this is a condition that a prudent lay person (someone who possesses an average knowledge of health and medicine) may potentially expect to result in complications if not addressed urgently such as respiratory distress, impairment of bodily function or dysfunction of bodily organs.   As such, the patient has been evaluated and assessed, work-up was performed and treatment was provided in alignment with urgent care protocols and evidence based medicine.  Patient/parent/caregiver has been advised that the patient may require follow up for further testing and/or treatment if the symptoms continue in spite of treatment, as clinically indicated and appropriate.  Routine symptom specific, illness specific and/or disease specific instructions  were discussed with the patient and/or caregiver at length.  Prevention strategies for avoiding STD exposure were also discussed.  The patient will follow up with their current PCP if and as advised. If the patient does not currently have a PCP we will assist them in obtaining one.   The patient may need specialty follow up if the symptoms continue, in spite of conservative treatment and management, for further workup, evaluation, consultation and treatment as clinically indicated and appropriate.  Patient/parent/caregiver verbalized understanding and agreement of plan as discussed.  All questions were addressed during visit.  Please see discharge instructions below for further details of plan.  Discharge Instructions:   Discharge Instructions      The urinalysis that we performed in the clinic today was abnormal.  Urine culture will be performed per our protocol.  The result of the urine culture will be available in the next 3 to 5 days and will be posted to your MyChart account.  If there is an abnormal finding, you will be contacted by phone and advised of further treatment recommendations, if any.   You were advised to begin antibiotics today because you are having active symptoms of an acute upper urinary tract infection also known as pyelonephritis.  It is very important that you take all doses exactly as prescribed.  Incomplete antibiotic therapy can cause worsening urinary tract infection that can become aggressive, escape from urinary tract into your bloodstream causing sepsis which will require hospitalization.   Please pick up and begin taking your prescription for Ciprofloxacin 500 mg as soon as possible.  Please take all doses exactly as prescribed.  You can take this medication with or without food.  This medication is safe to take with your other medications.   If you receive a phone call advising you that your urine culture is negative but you begin to feel better after taking  antibiotics for 24 hours, please feel free to complete the full course of antibiotics as they were likely needed and the urine culture result was false.  If your culture is negative and you do not feel any better, please return for repeat evaluation of other possible causes of your symptoms.   Please consider abstaining from sexual intercourse while you are being treated for urinary tract infection.   If you have not had complete resolution of your symptoms after completing treatment as prescribed, please return to urgent care for repeat evaluation or follow-up with your primary care provider.  Repeat urinalysis and urine culture may be indicated for  more directed therapy.  Your urine pregnancy test today is negative.   Thank you for visiting urgent care today.  I appreciate the opportunity to participate in your care.       This office note has been dictated using Teaching laboratory technician.  Unfortunately, this method of dictation can sometimes lead to typographical or grammatical errors.  I apologize for your inconvenience in advance if this occurs.  Please do not hesitate to reach out to me if clarification is needed.       Theadora Rama Scales, [place] 08/04/22 1333

## 2022-08-28 IMAGING — US US OB COMP LESS 14 WK
1 series · 15 of 28 positions shown · non-contrast
Comparison: None.

CLINICAL DATA: Upper abdominal pain.

EXAM:
OBSTETRIC <14 WK ULTRASOUND
TECHNIQUE: Transabdominal ultrasound was performed for evaluation of the
gestation as well as the maternal uterus and adnexal regions.

[Series 1: us ob comp less 14 wk · 36 acquisitions, 15 frames shown]
[im 1/36]
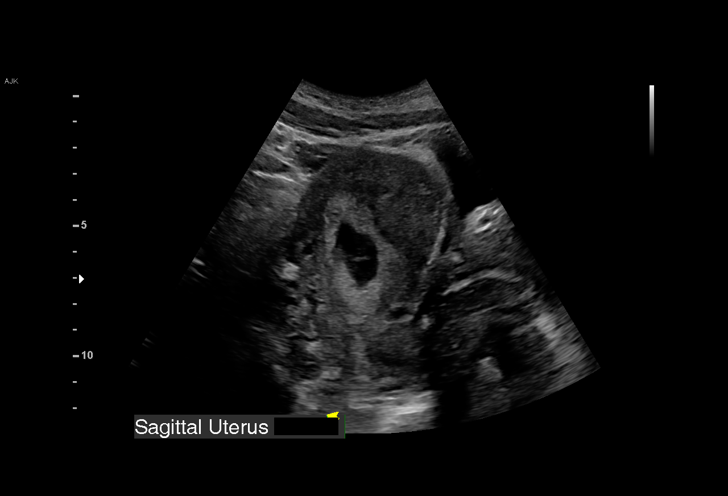
[im 3/36]
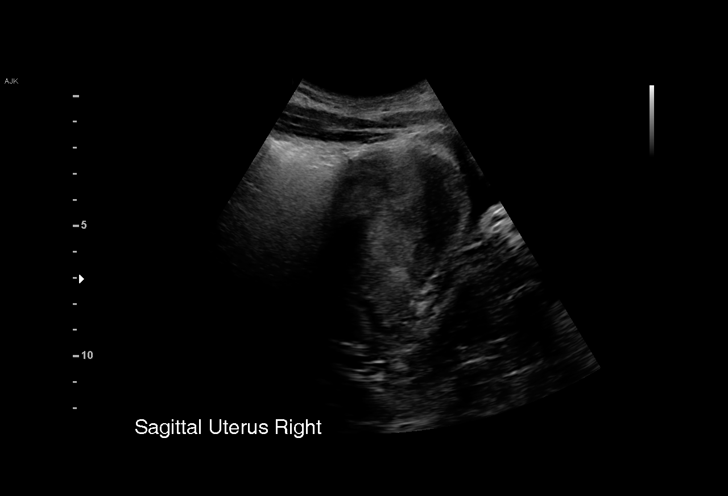
[im 6/36]
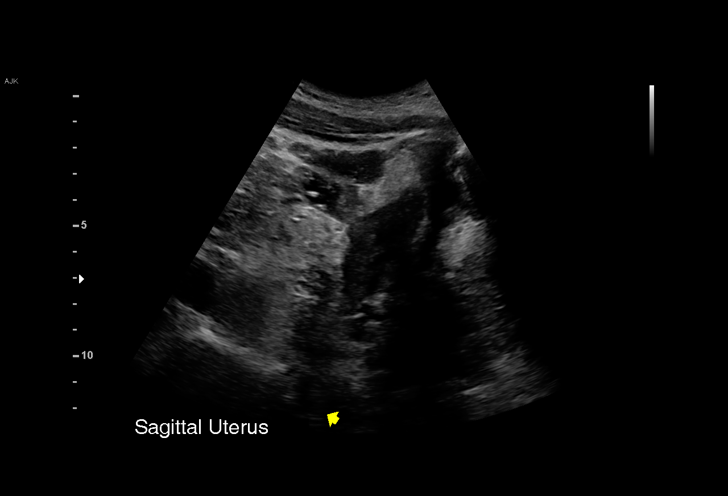
[im 8/36]
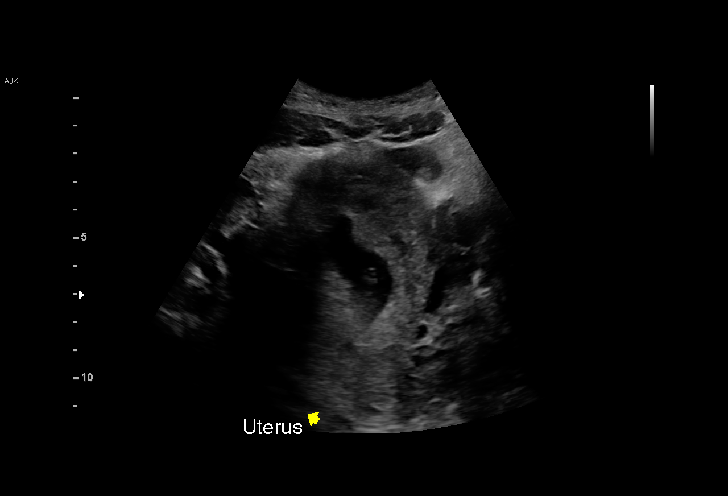
[im 11/36]
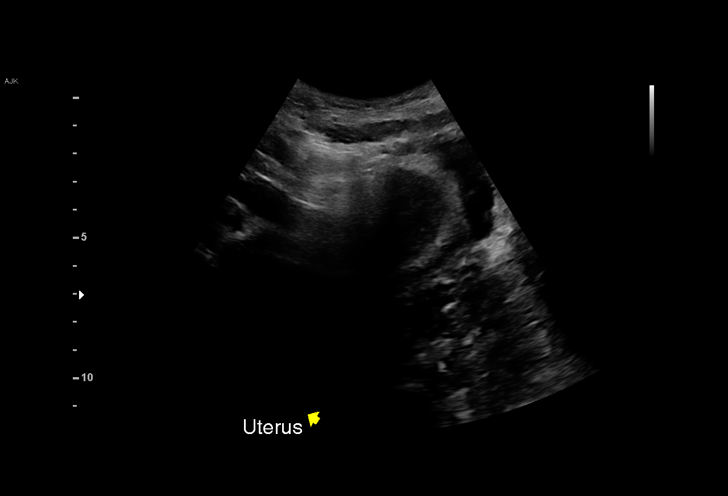
[im 13/36]
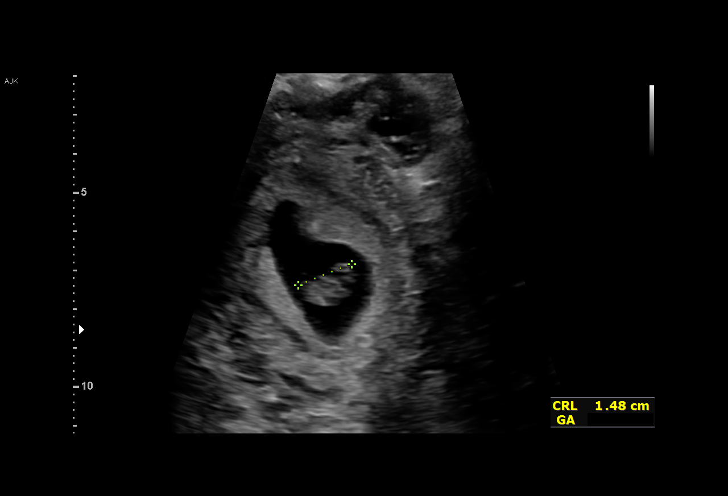
[im 16/36]
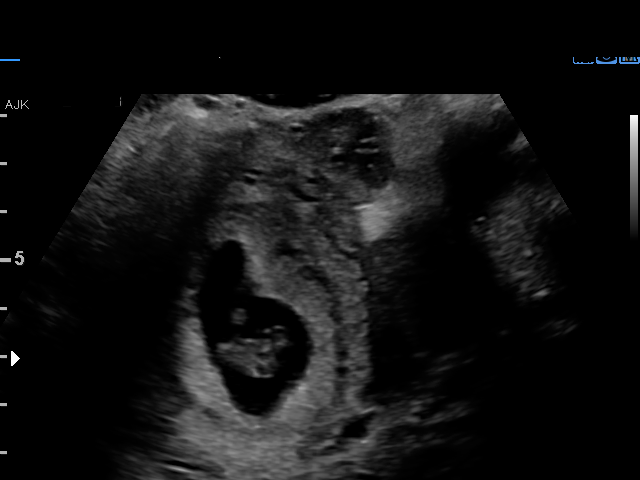
[im 19/36]
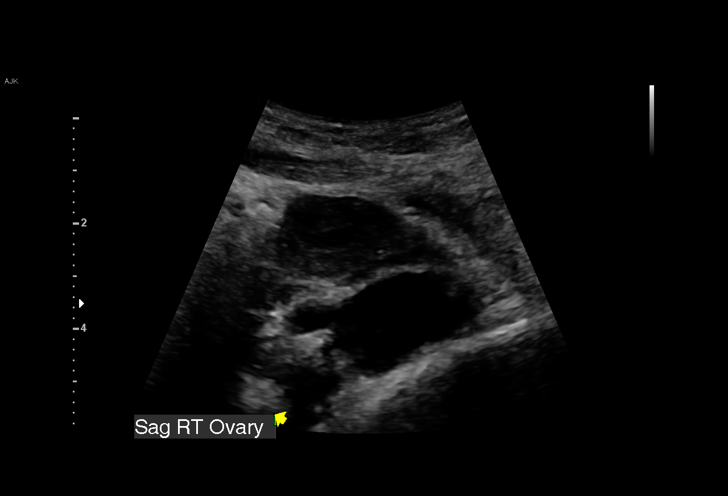
[im 20/36]
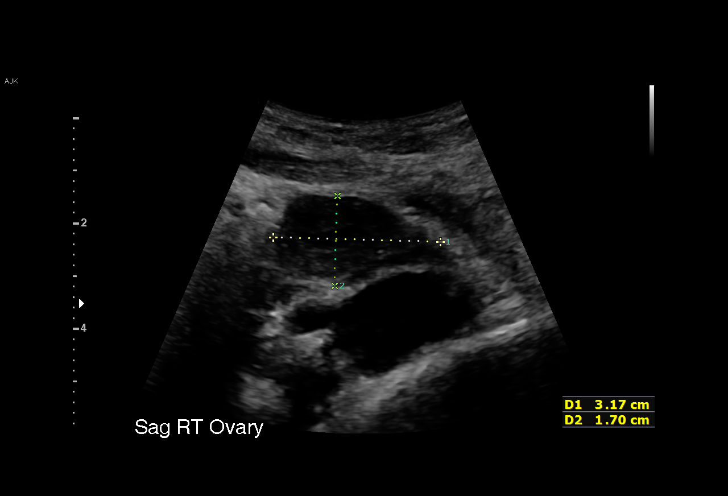
[im 23/36]
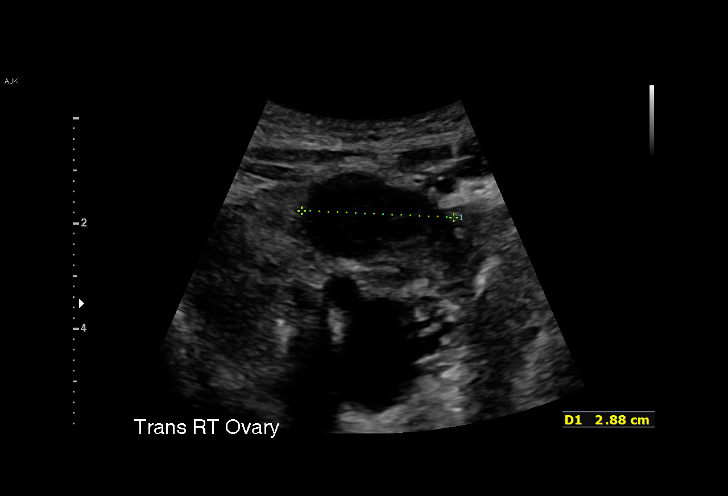
[im 25/36]
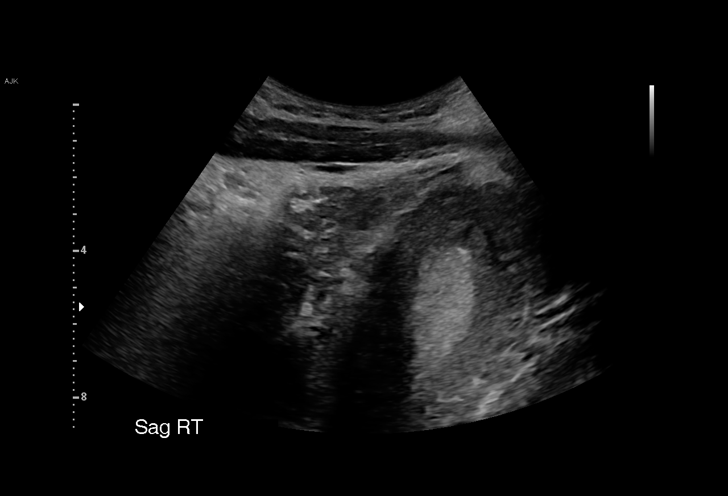
[im 28/36]
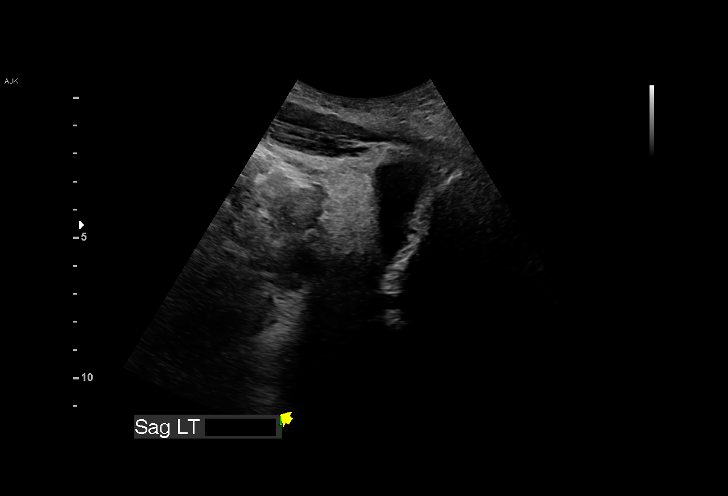
[im 30/36]
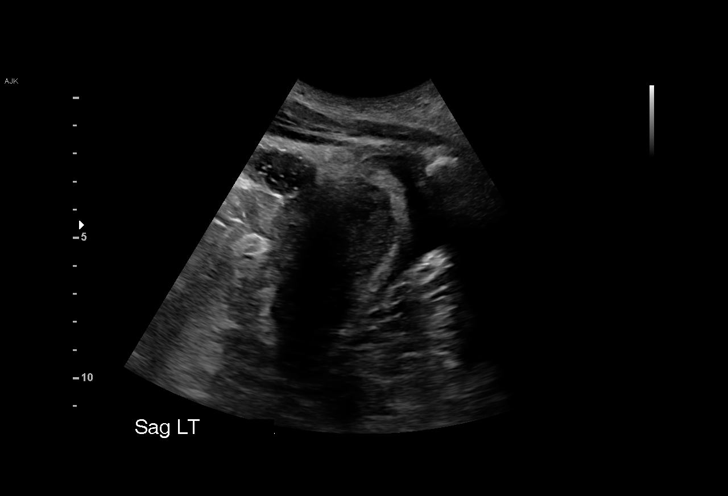
[im 33/36]
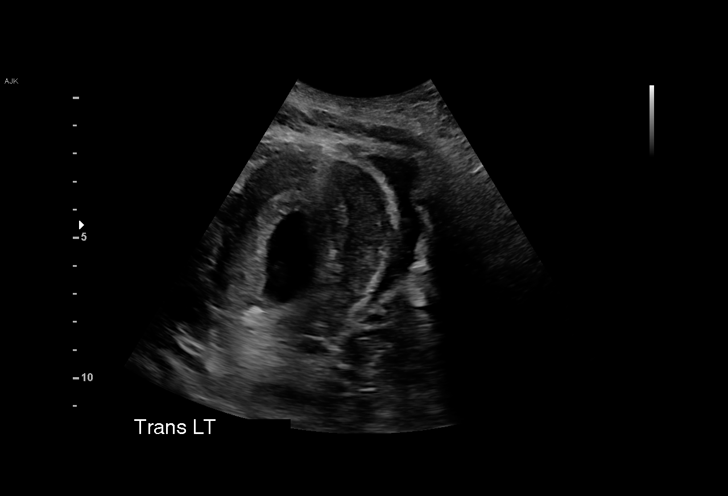
[im 36/36]
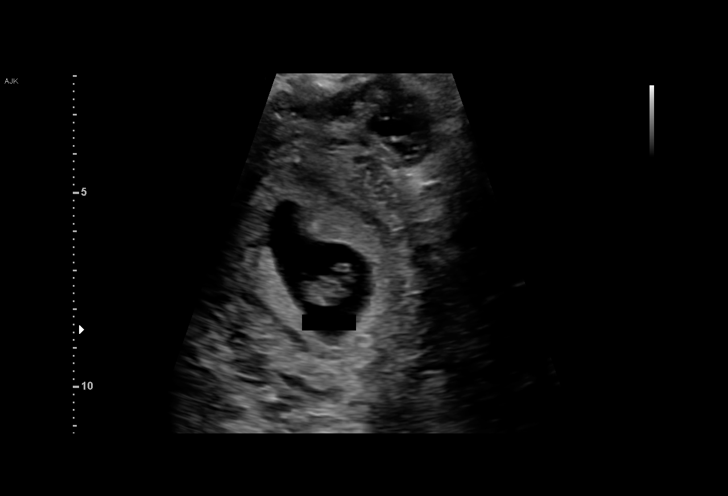

[15 of 28 positions shown; findings below may reference images not displayed]

FINDINGS: Intrauterine gestational sac: Single

Yolk sac:  Visualized.

Embryo:  Visualized.

Cardiac Activity: Visualized.

Heart Rate: 164 bpm

MSD:    mm    w     d

CRL:   15.3 mm   7 w 6 d                  US EDC: December 23, 2020

Subchorionic hemorrhage:  None visualized.

Maternal uterus/adnexae: The right ovary is normal. The left ovary
is not well visualized.
IMPRESSION: Single live IUP. No acute abnormalities. Poor visualization of the
left ovary.

## 2023-04-10 ENCOUNTER — Ambulatory Visit (HOSPITAL_COMMUNITY)
Admission: EM | Admit: 2023-04-10 | Discharge: 2023-04-10 | Disposition: A | Discharge status: Home / Self Care | Payer: Medicaid Other | Attending: Psychiatry | Admitting: Psychiatry

## 2023-04-10 DIAGNOSIS — Z760 Encounter for issue of repeat prescription: Secondary | ICD-10-CM | POA: Insufficient documentation

## 2023-04-10 DIAGNOSIS — Z560 Unemployment, unspecified: Secondary | ICD-10-CM | POA: Insufficient documentation

## 2023-04-10 DIAGNOSIS — F515 Nightmare disorder: Secondary | ICD-10-CM | POA: Insufficient documentation

## 2023-04-10 DIAGNOSIS — Z652 Problems related to release from prison: Secondary | ICD-10-CM | POA: Insufficient documentation

## 2023-04-10 DIAGNOSIS — F410 Panic disorder [episodic paroxysmal anxiety] without agoraphobia: Secondary | ICD-10-CM | POA: Insufficient documentation

## 2023-04-10 DIAGNOSIS — F419 Anxiety disorder, unspecified: Secondary | ICD-10-CM | POA: Insufficient documentation

## 2023-04-10 DIAGNOSIS — Z7689 Persons encountering health services in other specified circumstances: Secondary | ICD-10-CM

## 2023-04-10 NOTE — Discharge Instructions (Addendum)
Discharge recommendations:   Outpatient Follow up: Please review list of outpatient resources for psychiatry and counseling. Please follow up with your primary care provider for all medical related needs.   You are encouraged to follow up with [facility] for outpatient treatment.  Walk in/ Open Access Hours: Monday - Friday 8AM - 11AM (To see provider and therapist) Friday - 1PM - 4PM (To see therapist only)  [facility] [address] [place], [place] [phone]  Therapy: We recommend that patient participate in individual therapy to address mental health concerns.  Safety:   The following safety precautions should be taken:   No sharp objects. This includes scissors, razors, scrapers, and putty knives.   Chemicals should be removed and locked up.   Medications should be removed and locked up.   Weapons should be removed and locked up. This includes firearms, knives and instruments that can be used to cause injury.   The patient should abstain from use of illicit substances/drugs and abuse of any medications.  If symptoms worsen or do not continue to improve or if the patient becomes actively suicidal or homicidal then it is recommended that the patient return to the closest hospital emergency department, the [facility], or call 911 for further evaluation and treatment. National Suicide Prevention Lifeline 1-800-SUICIDE or 5022700187.  About 988 988 offers 24/7 access to trained crisis counselors who can help people experiencing mental health-related distress. People can call or text 988 or chat 988lifeline.org for themselves or if they are worried about a loved one who may need crisis support.

## 2023-04-10 NOTE — Progress Notes (Signed)
   04/10/23 1511  [facility] Triage Screening (Walk-ins at [facility] only)  How Did You Hear About [place]? Legal System  What Is the Reason for Your Visit/Call Today? Pt presents to [facility] voluntarily unaccompanied seeking an evaluation. Pt states she was released from jail 2 weeks ago and while she was in jail she was started on medication but she cannot recall the medication. Pt states she was prescribed medication because she was having hallucinations that someone was flying over her car. Pt states the court system has recommended her for an evaluation to get established with psychiatry services to continue her medication managment. Pt states she has to present paperwork to the court system that she has established care by 9/20. Pt denies SI/HI and AVH.  How Long Has This Been Causing You Problems? 1 wk - 1 month  Have You Recently Had Any Thoughts About Hurting Yourself? No  Are You Planning to Commit Suicide/Harm Yourself At This time? No  Have you Recently Had Thoughts About Hurting Someone Karolee Ohs? No  Are You Planning To Harm Someone At This Time? No  Are you currently experiencing any auditory, visual or other hallucinations? No  Have You Used Any Alcohol or Drugs in the Past 24 Hours? No  Do you have any current medical co-morbidities that require immediate attention? No  Clinician description of patient physical appearance/behavior: nervous, cooperative, casually dressed  What Do You Feel Would Help You the Most Today? Medication(s)  If access to [facility] was not available, would you have sought care in the Emergency Department? No  Determination of Need Routine (7 days)  Options For Referral Outpatient Therapy;Medication Management
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
## 2023-04-21 ENCOUNTER — Encounter (HOSPITAL_COMMUNITY): Payer: Self-pay | Admitting: Physician Assistant

## 2023-04-21 ENCOUNTER — Ambulatory Visit (HOSPITAL_COMMUNITY): Payer: Medicaid Other | Admitting: Physician Assistant

## 2023-04-21 ENCOUNTER — Other Ambulatory Visit (HOSPITAL_COMMUNITY): Payer: Self-pay | Admitting: Physician Assistant

## 2023-04-21 VITALS — BP 125/77 | HR 74 | Temp 97.8°F | Ht 65.0 in | Wt 157.6 lb

## 2023-04-21 DIAGNOSIS — F4310 Post-traumatic stress disorder, unspecified: Secondary | ICD-10-CM | POA: Diagnosis not present

## 2023-04-21 DIAGNOSIS — F331 Major depressive disorder, recurrent, moderate: Secondary | ICD-10-CM

## 2023-04-21 DIAGNOSIS — F411 Generalized anxiety disorder: Secondary | ICD-10-CM

## 2023-04-21 MED ORDER — MIRTAZAPINE 7.5 MG PO TABS: ORAL_TABLET | ORAL | 1 refills | Status: DC

## 2023-04-21 MED ORDER — PRAZOSIN HCL 1 MG PO CAPS: 1.0000 mg | ORAL_CAPSULE | Freq: Every day | ORAL | 1 refills | Status: DC
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
## 2023-04-22 ENCOUNTER — Ambulatory Visit (HOSPITAL_COMMUNITY): Payer: Medicaid Other | Admitting: Mental Health

## 2023-05-07 ENCOUNTER — Ambulatory Visit: Payer: Medicaid Other

## 2023-05-08 ENCOUNTER — Ambulatory Visit: Payer: Medicaid Other

## 2023-05-13 ENCOUNTER — Other Ambulatory Visit (HOSPITAL_COMMUNITY): Payer: Self-pay | Admitting: Physician Assistant

## 2023-05-13 DIAGNOSIS — F4310 Post-traumatic stress disorder, unspecified: Secondary | ICD-10-CM

## 2023-05-13 DIAGNOSIS — F331 Major depressive disorder, recurrent, moderate: Secondary | ICD-10-CM

## 2023-05-13 DIAGNOSIS — F411 Generalized anxiety disorder: Secondary | ICD-10-CM

## 2023-05-13 MED ORDER — MIRTAZAPINE 7.5 MG PO TABS: 7.5000 mg | ORAL_TABLET | Freq: Every day | ORAL | 0 refills | Status: DC

## 2023-05-13 NOTE — Progress Notes (Signed)
Provider previously attempted to refill patient's mirtazapine prescription; however, the refill was denied.  Provider attempting to refill patient's prescription for mirtazapine 7.5 mg at bedtime.

## 2023-05-27 ENCOUNTER — Other Ambulatory Visit (HOSPITAL_COMMUNITY): Payer: Self-pay | Admitting: Physician Assistant

## 2023-05-27 DIAGNOSIS — F4310 Post-traumatic stress disorder, unspecified: Secondary | ICD-10-CM

## 2023-05-27 DIAGNOSIS — F411 Generalized anxiety disorder: Secondary | ICD-10-CM

## 2023-05-27 DIAGNOSIS — F331 Major depressive disorder, recurrent, moderate: Secondary | ICD-10-CM

## 2023-06-01 ENCOUNTER — Ambulatory Visit
Admission: EM | Admit: 2023-06-01 | Discharge: 2023-06-01 | Disposition: A | Discharge status: Home / Self Care | Payer: Medicaid Other | Attending: Internal Medicine | Admitting: Internal Medicine

## 2023-06-01 DIAGNOSIS — R070 Pain in throat: Secondary | ICD-10-CM | POA: Diagnosis not present

## 2023-06-01 DIAGNOSIS — Z7251 High risk heterosexual behavior: Secondary | ICD-10-CM | POA: Insufficient documentation

## 2023-06-01 DIAGNOSIS — B349 Viral infection, unspecified: Secondary | ICD-10-CM | POA: Diagnosis not present

## 2023-06-01 DIAGNOSIS — R52 Pain, unspecified: Secondary | ICD-10-CM | POA: Insufficient documentation

## 2023-06-01 LAB — POCT URINALYSIS DIP (MANUAL ENTRY)
Glucose, UA: NEGATIVE mg/dL
Leukocytes, UA: NEGATIVE
Nitrite, UA: NEGATIVE
Spec Grav, UA: 1.025 (ref 1.010–1.025)
Urobilinogen, UA: 1 U/dL
pH, UA: 6 (ref 5.0–8.0)

## 2023-06-01 LAB — POCT RAPID STREP A (OFFICE): Rapid Strep A Screen: NEGATIVE

## 2023-06-01 LAB — POC COVID19/FLU A&B COMBO
Covid Antigen, POC: NEGATIVE
Influenza A Antigen, POC: NEGATIVE
Influenza B Antigen, POC: NEGATIVE

## 2023-06-01 LAB — POCT URINE PREGNANCY: Preg Test, Ur: NEGATIVE

## 2023-06-01 MED ORDER — ACETAMINOPHEN 325 MG PO TABS
650.0000 mg | ORAL_TABLET | Freq: Once | ORAL | Status: AC
  Administered 2023-06-01: 650 mg via ORAL

## 2023-06-01 MED ORDER — IBUPROFEN 800 MG PO TABS
800.0000 mg | ORAL_TABLET | Freq: Once | ORAL | Status: AC
  Administered 2023-06-01: 800 mg via ORAL

## 2023-06-01 MED ORDER — IBUPROFEN 600 MG PO TABS: 600.0000 mg | ORAL_TABLET | Freq: Four times a day (QID) | ORAL | 0 refills | Status: AC | PRN

## 2023-06-01 MED ORDER — PROMETHAZINE-DM 6.25-15 MG/5ML PO SYRP: 5.0000 mL | ORAL_SOLUTION | Freq: Three times a day (TID) | ORAL | 0 refills | Status: AC | PRN

## 2023-06-01 MED ORDER — CETIRIZINE HCL 10 MG PO TABS: 10.0000 mg | ORAL_TABLET | Freq: Every day | ORAL | 0 refills | Status: AC

## 2023-06-01 MED ORDER — PSEUDOEPHEDRINE HCL 30 MG PO TABS: 30.0000 mg | ORAL_TABLET | Freq: Three times a day (TID) | ORAL | 0 refills | Status: AC | PRN

## 2023-06-01 NOTE — ED Provider Notes (Signed)
[facility]  Note:  This document was prepared using Conservation officer, historic buildings and may include unintentional dictation errors.  MRN: [id number] DOB: 2000/12/02  Subjective:   Caitlin King is a 22 y.o. female presenting for 3-day history of malaise, throat pain, bilateral ear pain, low back pain, body pains, fever.  Patient would like to be checked for sexually transmitted infections.  No chest pain, shortness of breath or wheezing.  No smoking of any kind including cigarettes, cigars, vaping, marijuana use.  She did have 1 sick contact with her daughter who had similar symptoms.  No current facility-administered medications for this encounter.  Current Outpatient Medications:    guaifenesin (HUMIBID E) 400 MG TABS tablet, Take 1 tablet 3 times daily as needed for chest congestion and cough, Disp: 21 tablet, Rfl: 0   ibuprofen (ADVIL) 600 MG tablet, Take 1 tablet (600 mg total) by mouth every 8 (eight) hours as needed (pain)., Disp: 15 tablet, Rfl: 0   ipratropium (ATROVENT) 0.06 % nasal spray, Place 2 sprays into both nostrils 3 (three) times daily. As needed for nasal congestion, runny nose, Disp: 15 mL, Rfl: 1   mirtazapine (REMERON) 7.5 MG tablet, TAKE 1 TABLET BY MOUTH AT BEDTIME., Disp: 90 tablet, Rfl: 1   prazosin (MINIPRESS) 1 MG capsule, TAKE 1 CAPSULE BY MOUTH AT BEDTIME., Disp: 90 capsule, Rfl: 1   No Known Allergies  Past Medical History:  Diagnosis Date   Chlamydia    Hx of chlamydia infection 01/2020   Hx of ectopic pregnancy 01/2020   Hx of gonorrhea 01/2020     Past Surgical History:  Procedure Laterality Date   CESAREAN SECTION N/A 12/20/2020   Procedure: CESAREAN SECTION;  Surgeon: Rande Brunt, MD;  Location: [facility] ORS;  Service: Obstetrics;  Laterality: N/A;    Family History  Problem Relation Age of Onset   Healthy Mother    Healthy Father    Asthma Brother    Alcohol abuse Neg Hx    Arthritis Neg Hx    Birth  defects Neg Hx    Cancer Neg Hx    Depression Neg Hx    COPD Neg Hx    Diabetes Neg Hx    Drug abuse Neg Hx    Early death Neg Hx    Hearing loss Neg Hx    Heart disease Neg Hx    Hyperlipidemia Neg Hx    Hypertension Neg Hx    Learning disabilities Neg Hx    Kidney disease Neg Hx    Mental illness Neg Hx    Mental retardation Neg Hx    Miscarriages / Stillbirths Neg Hx    Stroke Neg Hx    Vision loss Neg Hx    Varicose Veins Neg Hx     Social History   Tobacco Use   Smoking status: Never   Smokeless tobacco: Never  Vaping Use   Vaping status: Never Used  Substance Use Topics   Alcohol use: Not Currently   Drug use: Never    ROS   Objective:   Vitals: BP 116/73 (BP Location: Right Arm)   Pulse (!) 107   Temp (!) 102.8 F (39.3 C) (Oral)   Resp 20   SpO2 99%   Physical Exam Constitutional:      General: She is not in acute distress.    Appearance: Normal appearance. She is well-developed and normal weight. She is not ill-appearing, toxic-appearing or diaphoretic.  HENT:  Head: Normocephalic and atraumatic.     Right Ear: Tympanic membrane, ear canal and external ear normal. No drainage or tenderness. No middle ear effusion. There is no impacted cerumen. Tympanic membrane is not erythematous or bulging.     Left Ear: Tympanic membrane, ear canal and external ear normal. No drainage or tenderness.  No middle ear effusion. There is no impacted cerumen. Tympanic membrane is not erythematous or bulging.     Nose: Nose normal. No congestion or rhinorrhea.     Mouth/Throat:     Mouth: Mucous membranes are moist. No oral lesions.     Pharynx: Oropharyngeal exudate and posterior oropharyngeal erythema present. No pharyngeal swelling or uvula swelling.     Tonsils: No tonsillar exudate or tonsillar abscesses. 0 on the right. 0 on the left.  Eyes:     General: No scleral icterus.       Right eye: No discharge.        Left eye: No discharge.     Extraocular  Movements: Extraocular movements intact.     Right eye: Normal extraocular motion.     Left eye: Normal extraocular motion.     Conjunctiva/sclera: Conjunctivae normal.  Cardiovascular:     Rate and Rhythm: Normal rate and regular rhythm.     Heart sounds: Normal heart sounds. No murmur heard.    No friction rub. No gallop.  Pulmonary:     Effort: Pulmonary effort is normal. No respiratory distress.     Breath sounds: No stridor. No wheezing, rhonchi or rales.  Chest:     Chest wall: No tenderness.  Abdominal:     General: Bowel sounds are normal. There is no distension.     Palpations: Abdomen is soft. There is no mass.     Tenderness: There is no abdominal tenderness. There is no right CVA tenderness, left CVA tenderness, guarding or rebound.  Musculoskeletal:     Cervical back: Normal range of motion and neck supple.  Lymphadenopathy:     Cervical: No cervical adenopathy.  Skin:    General: Skin is warm and dry.  Neurological:     General: No focal deficit present.     Mental Status: She is alert and oriented to person, place, and time.  Psychiatric:        Mood and Affect: Mood normal.        Behavior: Behavior normal.        Thought Content: Thought content normal.        Judgment: Judgment normal.     Results for orders placed or performed during the hospital encounter of 06/01/23 (from the past 24 hour(s))  POCT urine pregnancy     Status: None   Collection Time: 06/01/23  6:17 PM  Result Value Ref Range   Preg Test, Ur Negative Negative  POCT urinalysis dipstick     Status: Abnormal   Collection Time: 06/01/23  6:17 PM  Result Value Ref Range   Color, UA yellow yellow   Clarity, UA clear clear   Glucose, UA negative negative mg/dL   Bilirubin, UA small (A) negative   Ketones, POC UA large (80) (A) negative mg/dL   Spec Grav, UA 8.657 8.469 - 1.025   Blood, UA trace-intact (A) negative   pH, UA 6.0 5.0 - 8.0   Protein Ur, POC trace (A) negative mg/dL    Urobilinogen, UA 1.0 0.2 or 1.0 E.U./dL   Nitrite, UA Negative Negative   Leukocytes, UA Negative Negative  POC  Covid + Flu A/B Antigen     Status: None   Collection Time: 06/01/23  6:40 PM  Result Value Ref Range   Influenza A Antigen, POC Negative Negative   Influenza B Antigen, POC Negative Negative   Covid Antigen, POC Negative Negative  POCT rapid strep A     Status: None   Collection Time: 06/01/23  6:53 PM  Result Value Ref Range   Rapid Strep A Screen Negative Negative    Assessment and Plan :   PDMP not reviewed this encounter.  1. Acute viral syndrome   2. Body aches   3. Throat pain   4. Unprotected sex    Strep culture, vaginal cytology pending.  Recommended supportive care for an acute viral syndrome.  Deferred imaging given clear cardiopulmonary exam, hemodynamically stable vital signs. Counseled patient on potential for adverse effects with medications prescribed/recommended today, ER and return-to-clinic precautions discussed, patient verbalized understanding.    Wallis Bamberg, [place] 06/01/23 1610

## 2023-06-01 NOTE — Discharge Instructions (Signed)
We will manage this as a viral illness, a viral syndrome. For sore throat or cough try using a honey-based tea. Use 3 teaspoons of honey with juice squeezed from half lemon. Place shaved pieces of ginger into 1/2-1 cup of water and warm over stove top. Then mix the ingredients and repeat every 4 hours as needed. Please take ibuprofen 600mg  every 6 hours with food alternating with OR taken together with Tylenol 500mg -650mg  every 6 hours for throat pain, fevers, aches and pains. Hydrate very well with at least 2 liters of water. Eat light meals such as soups (chicken and noodles, vegetable, chicken and wild rice).  Do not eat foods that you are allergic to.  Taking an antihistamine like Zyrtec (10mg  daily) can help against postnasal drainage, sinus congestion which can cause sinus pain, sinus headaches, throat pain, painful swallowing, coughing.  You can take this together with pseudoephedrine (Sudafed) at a dose of 30mg  3 times a day or twice daily as needed for the same kind of nasal drip, congestion.  Use cough syrup as needed.

## 2023-06-01 NOTE — ED Triage Notes (Addendum)
Pr c/o "hurting all over"-will report pain to lower back, ears, throat and head x 3 days-last dose ibuprofen ~6am-steady gait/tearful

## 2023-06-02 ENCOUNTER — Other Ambulatory Visit: Payer: Self-pay

## 2023-06-02 ENCOUNTER — Encounter (HOSPITAL_COMMUNITY): Payer: Self-pay

## 2023-06-02 ENCOUNTER — Emergency Department (HOSPITAL_COMMUNITY): Payer: Medicaid Other

## 2023-06-02 ENCOUNTER — Encounter (HOSPITAL_COMMUNITY): Payer: Medicaid Other | Admitting: Physician Assistant

## 2023-06-02 ENCOUNTER — Emergency Department (HOSPITAL_COMMUNITY)
Admission: EM | Admit: 2023-06-02 | Discharge: 2023-06-02 | Disposition: A | Discharge status: Home / Self Care | Payer: Medicaid Other | Attending: Emergency Medicine | Admitting: Emergency Medicine

## 2023-06-02 DIAGNOSIS — R1084 Generalized abdominal pain: Secondary | ICD-10-CM | POA: Insufficient documentation

## 2023-06-02 DIAGNOSIS — E860 Dehydration: Secondary | ICD-10-CM

## 2023-06-02 DIAGNOSIS — R059 Cough, unspecified: Secondary | ICD-10-CM | POA: Diagnosis not present

## 2023-06-02 DIAGNOSIS — R509 Fever, unspecified: Secondary | ICD-10-CM | POA: Diagnosis not present

## 2023-06-02 DIAGNOSIS — R070 Pain in throat: Secondary | ICD-10-CM | POA: Diagnosis not present

## 2023-06-02 DIAGNOSIS — J111 Influenza due to unidentified influenza virus with other respiratory manifestations: Secondary | ICD-10-CM

## 2023-06-02 DIAGNOSIS — R102 Pelvic and perineal pain: Secondary | ICD-10-CM | POA: Diagnosis not present

## 2023-06-02 DIAGNOSIS — R1030 Lower abdominal pain, unspecified: Secondary | ICD-10-CM | POA: Diagnosis present

## 2023-06-02 DIAGNOSIS — R1111 Vomiting without nausea: Secondary | ICD-10-CM | POA: Diagnosis not present

## 2023-06-02 DIAGNOSIS — J101 Influenza due to other identified influenza virus with other respiratory manifestations: Secondary | ICD-10-CM | POA: Diagnosis not present

## 2023-06-02 DIAGNOSIS — R1032 Left lower quadrant pain: Secondary | ICD-10-CM | POA: Diagnosis not present

## 2023-06-02 LAB — COMPREHENSIVE METABOLIC PANEL
ALT: 16 U/L (ref 0–44)
AST: 21 U/L (ref 15–41)
Albumin: 3.6 g/dL (ref 3.5–5.0)
Alkaline Phosphatase: 47 U/L (ref 38–126)
Anion gap: 12 (ref 5–15)
BUN: 6 mg/dL (ref 6–20)
CO2: 18 mmol/L — ABNORMAL LOW (ref 22–32)
Calcium: 8.9 mg/dL (ref 8.9–10.3)
Chloride: 102 mmol/L (ref 98–111)
Creatinine, Ser: 0.83 mg/dL (ref 0.44–1.00)
GFR, Estimated: >60 mL/min (ref >60)
Glucose, Bld: 95 mg/dL (ref 70–99)
Potassium: 3.4 mmol/L — ABNORMAL LOW (ref 3.5–5.1)
Sodium: 132 mmol/L — ABNORMAL LOW (ref 135–145)
Total Bilirubin: 1.6 mg/dL — ABNORMAL HIGH (ref <1.2)
Total Protein: 7 g/dL (ref 6.5–8.1)

## 2023-06-02 LAB — CERVICOVAGINAL ANCILLARY ONLY
Chlamydia: NEGATIVE
Comment: NEGATIVE
Comment: NEGATIVE
Comment: NORMAL
Neisseria Gonorrhea: NEGATIVE
Trichomonas: NEGATIVE

## 2023-06-02 LAB — CBC WITH DIFFERENTIAL/PLATELET
Abs Immature Granulocytes: 0.03 10*3/uL (ref 0.00–0.07)
Basophils Absolute: 0 10*3/uL (ref 0.0–0.1)
Basophils Relative: 0 %
Eosinophils Absolute: 0 10*3/uL (ref 0.0–0.5)
Eosinophils Relative: 0 %
HCT: 38.9 % (ref 36.0–46.0)
Hemoglobin: 12.8 g/dL (ref 12.0–15.0)
Immature Granulocytes: 0 %
Lymphocytes Relative: 14 %
Lymphs Abs: 1.2 10*3/uL (ref 0.7–4.0)
MCH: 30.5 pg (ref 26.0–34.0)
MCHC: 32.9 g/dL (ref 30.0–36.0)
MCV: 92.6 fL (ref 80.0–100.0)
Monocytes Absolute: 0.7 10*3/uL (ref 0.1–1.0)
Monocytes Relative: 9 %
Neutro Abs: 6.1 10*3/uL (ref 1.7–7.7)
Neutrophils Relative %: 77 %
Platelets: 252 10*3/uL (ref 150–400)
RBC: 4.2 MIL/uL (ref 3.87–5.11)
RDW: 12.2 % (ref 11.5–15.5)
WBC: 8.1 10*3/uL (ref 4.0–10.5)
nRBC: 0 % (ref 0.0–0.2)

## 2023-06-02 LAB — HCG, SERUM, QUALITATIVE: Preg, Serum: NEGATIVE

## 2023-06-02 LAB — URINALYSIS, ROUTINE W REFLEX MICROSCOPIC
Bilirubin Urine: NEGATIVE
Glucose, UA: NEGATIVE mg/dL
Hgb urine dipstick: NEGATIVE
Ketones, ur: 80 mg/dL — AB
Leukocytes,Ua: NEGATIVE
Nitrite: NEGATIVE
Protein, ur: 100 mg/dL — AB
Specific Gravity, Urine: >1.046 — ABNORMAL HIGH (ref 1.005–1.030)
pH: 5 (ref 5.0–8.0)

## 2023-06-02 LAB — LIPASE, BLOOD: Lipase: 19 U/L (ref 11–51)

## 2023-06-02 MED ORDER — KETOROLAC TROMETHAMINE 15 MG/ML IJ SOLN
15.0000 mg | Freq: Once | INTRAMUSCULAR | Status: AC
  Administered 2023-06-02: 15 mg via INTRAVENOUS
  Filled 2023-06-02: qty 1

## 2023-06-02 MED ORDER — ONDANSETRON HCL 4 MG/2ML IJ SOLN
4.0000 mg | Freq: Once | INTRAMUSCULAR | Status: AC
  Administered 2023-06-02: 4 mg via INTRAVENOUS
  Filled 2023-06-02: qty 2

## 2023-06-02 MED ORDER — IOHEXOL 350 MG/ML SOLN
75.0000 mL | Freq: Once | INTRAVENOUS | Status: AC | PRN
  Administered 2023-06-02: 75 mL via INTRAVENOUS

## 2023-06-02 MED ORDER — ONDANSETRON 4 MG PO TBDP: 4.0000 mg | ORAL_TABLET | Freq: Three times a day (TID) | ORAL | 0 refills | Status: AC | PRN

## 2023-06-02 MED ORDER — ACETAMINOPHEN 325 MG PO TABS
650.0000 mg | ORAL_TABLET | Freq: Once | ORAL | Status: AC
  Administered 2023-06-02: 650 mg via ORAL
  Filled 2023-06-02: qty 2

## 2023-06-02 NOTE — ED Triage Notes (Signed)
Pt BIB EMS for fever and flu like sx, pt went to UC yesterday and was neg for Covid, flu, and strep. Pt arrives crying. N/V. Back pain.

## 2023-06-02 NOTE — ED Notes (Signed)
Got patient into a gown on the monitor patient is resting with call bell in reach 

## 2023-06-02 NOTE — ED Provider Notes (Signed)
[place] EMERGENCY DEPARTMENT AT [facility] Provider Note   CSN: [id number] Arrival date & time: 06/02/23  0935     History  Chief Complaint  Patient presents with   Fever    Caitlin King is a 22 y.o. female with a past medical history significant for history of ectopic pregnancy, history of STIs, and anxiety who presents to the ED due to lower abdominal pain associated with nausea, vomiting, and fever x 4 days.  Also admits to a cough and sore throat.  Seen at urgent care yesterday where her COVID, flu, and strep test were negative.  Denies any vaginal discharge.  No concern for STIs.  Had full STI panel yesterday at urgent care, results pending.  Denies diarrhea.  Unable to tolerate p.o.  Previous C-section however, no other abdominal operations. Denies urinary symptoms.  History obtained from patient and past medical records. No interpreter used during encounter.       Home Medications Prior to Admission medications   Medication Sig Start Date End Date Taking? Authorizing Provider  mirtazapine (REMERON) 7.5 MG tablet TAKE 1 TABLET BY MOUTH AT BEDTIME. Patient taking differently: Take 7.5 mg by mouth daily. 05/28/23  Yes Nwoko, Stephens Shire E, PA  prazosin (MINIPRESS) 1 MG capsule TAKE 1 CAPSULE BY MOUTH AT BEDTIME. 05/13/23  Yes Nwoko, Tommas Olp, PA  cetirizine (ZYRTEC ALLERGY) 10 MG tablet Take 1 tablet (10 mg total) by mouth daily. Patient not taking: Reported on 06/02/2023 06/01/23   Wallis Bamberg, PA-C  guaifenesin (HUMIBID E) 400 MG TABS tablet Take 1 tablet 3 times daily as needed for chest congestion and cough Patient not taking: Reported on 06/02/2023 06/04/22   Theadora Rama Scales, PA-C  ibuprofen (ADVIL) 600 MG tablet Take 1 tablet (600 mg total) by mouth every 6 (six) hours as needed. Patient not taking: Reported on 06/02/2023 06/01/23   Wallis Bamberg, PA-C  ondansetron (ZOFRAN-ODT) 4 MG disintegrating tablet Take 1 tablet (4 mg total) by mouth every 8 (eight) hours  as needed for nausea or vomiting. 06/02/23  Yes Aidynn Krenn, Merla Riches, PA-C  promethazine-dextromethorphan (PROMETHAZINE-DM) 6.25-15 MG/5ML syrup Take 5 mLs by mouth 3 (three) times daily as needed for cough. Patient not taking: Reported on 06/02/2023 06/01/23   Wallis Bamberg, PA-C  pseudoephedrine (SUDAFED) 30 MG tablet Take 1 tablet (30 mg total) by mouth every 8 (eight) hours as needed for congestion. Patient not taking: Reported on 06/02/2023 06/01/23   Wallis Bamberg, PA-C      Allergies    Patient has no known allergies.    Review of Systems   Review of Systems  Constitutional:  Positive for fever.  HENT:  Positive for sore throat.   Respiratory:  Negative for shortness of breath.   Cardiovascular:  Negative for chest pain.  Gastrointestinal:  Positive for abdominal pain, nausea and vomiting.    Physical Exam Updated Vital Signs BP (!) 107/59   Pulse 93   Temp 98.6 F (37 C) (Oral)   Resp (!) 23   Ht 5\' 5"  (1.651 m)   Wt 74.8 kg   LMP 05/10/2023 (Approximate)   SpO2 100%   BMI 27.46 kg/m  Physical Exam Vitals and nursing note reviewed.  Constitutional:      General: She is not in acute distress.    Appearance: She is not ill-appearing.  HENT:     Head: Normocephalic.  Eyes:     Pupils: Pupils are equal, round, and reactive to light.  Cardiovascular:  Rate and Rhythm: Normal rate and regular rhythm.     Pulses: Normal pulses.     Heart sounds: Normal heart sounds. No murmur heard.    No friction rub. No gallop.  Pulmonary:     Effort: Pulmonary effort is normal.     Breath sounds: Normal breath sounds.  Abdominal:     General: Abdomen is flat. There is no distension.     Palpations: Abdomen is soft.     Tenderness: There is abdominal tenderness. There is no guarding or rebound.     Comments: Diffuse tenderness, most significant in lower quadrants.   Genitourinary:    Comments: Patient declined pelvic exam Musculoskeletal:        General: Normal range of motion.      Cervical back: Neck supple.  Skin:    General: Skin is warm and dry.  Neurological:     General: No focal deficit present.     Mental Status: She is alert.  Psychiatric:        Mood and Affect: Mood normal.        Behavior: Behavior normal.     ED Results / Procedures / Treatments   Labs (all labs ordered are listed, but only abnormal results are displayed) Labs Reviewed  COMPREHENSIVE METABOLIC PANEL - Abnormal; Notable for the following components:      Result Value   Sodium 132 (*)    Potassium 3.4 (*)    CO2 18 (*)    Total Bilirubin 1.6 (*)    All other components within normal limits  CBC WITH DIFFERENTIAL/PLATELET  LIPASE, BLOOD  HCG, SERUM, QUALITATIVE  URINALYSIS, ROUTINE W REFLEX MICROSCOPIC    EKG None  Radiology CT ABDOMEN PELVIS W CONTRAST  Result Date: 06/02/2023 CLINICAL DATA:  Left lower quadrant pain. EXAM: CT ABDOMEN AND PELVIS WITH CONTRAST TECHNIQUE: Multidetector CT imaging of the abdomen and pelvis was performed using the standard protocol following bolus administration of intravenous contrast. RADIATION DOSE REDUCTION: This exam was performed according to the departmental dose-optimization program which includes automated exposure control, adjustment of the mA and/or kV according to patient size and/or use of iterative reconstruction technique. CONTRAST:  75mL OMNIPAQUE IOHEXOL 350 MG/ML SOLN COMPARISON:  None Available. FINDINGS: Lower chest: The imaged lung bases are clear. The imaged heart is unremarkable. Hepatobiliary: The liver and gallbladder are unremarkable. There is no biliary ductal dilatation. Pancreas: Unremarkable. Spleen: Unremarkable. Adrenals/Urinary Tract: The adrenals are unremarkable. The kidneys are unremarkable, with no focal lesion, stone, hydronephrosis, or hydroureter. The bladder is unremarkable. Stomach/Bowel: The stomach is unremarkable. There is no evidence of bowel obstruction. There is no abnormal bowel wall thickening or  inflammatory change. The tentatively identified appendix is normal. Vascular/Lymphatic: The abdominal aorta is normal in course and caliber. The major branch vessels are patent. The main portal and splenic veins are patent. Incidental note is made of a retroaortic left renal vein. There is no abdominal or pelvic lymphadenopathy. Reproductive: The uterus and adnexa are unremarkable. Other: There is no ascites or free air. Musculoskeletal: There is no acute osseous abnormality or suspicious osseous lesion. There are chronic appearing bilateral pars defects at L4-L5 without resulting anterolisthesis. IMPRESSION: 1. No acute finding in the abdomen or pelvis. 2. 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 Electronically Signed   By: Lesia Hausen M.D.   On: 06/02/2023 14:05   DG Chest Portable 1 View  Result Date: 06/02/2023 CLINICAL DATA:  Fever, cough EXAM: PORTABLE CHEST 1 VIEW COMPARISON:  None Available. FINDINGS: Cardiac and mediastinal contours are within normal limits. No focal pulmonary opacity. No pleural effusion or pneumothorax. No acute osseous abnormality. IMPRESSION: No acute cardiopulmonary process. Electronically Signed   By: Wiliam Ke M.D.   On: 06/02/2023 12:29    Procedures Procedures    Medications Ordered in ED Medications  ketorolac (TORADOL) 15 MG/ML injection 15 mg (15 mg Intravenous Given 06/02/23 1033)  ondansetron (ZOFRAN) injection 4 mg (4 mg Intravenous Given 06/02/23 1033)  iohexol (OMNIPAQUE) 350 MG/ML injection 75 mL (75 mLs Intravenous Contrast Given 06/02/23 1141)  acetaminophen (TYLENOL) tablet 650 mg (650 mg Oral Given 06/02/23 1420)    ED Course/ Medical Decision Making/ A&P Clinical Course as of 06/02/23 1507  Tue Jun 02, 2023  1254 CT ABDOMEN PELVIS W CONTRAST [KJ]    Clinical Course User Index [KJ] Etta Quill                                 Medical Decision Making Amount and/or Complexity of Data  Reviewed External Data Reviewed: notes.    Details: UC note Labs: ordered. Decision-making details documented in ED Course. Radiology: ordered and independent interpretation performed. Decision-making details documented in ED Course.  Risk OTC drugs. Prescription drug management.   This patient presents to the ED for concern of lower abdominal pain, this involves an extensive number of treatment options, and is a complaint that carries with it a high risk of complications and morbidity.  The differential diagnosis includes appendicitis, PID, ovarian torsion, ectopic pregnancy, diverticulitis, gastroenteritis, etc  22 year old female presents to the ED due to lower abdominal pain associated with nausea, vomiting, fever, and low back pain.  Denies vaginal discharge.  No concern for STIs.  Denies urinary symptoms.  Was seen at urgent care yesterday where her symptoms were thought to be secondary to a viral infection.  Patient had full STI panel yesterday, results pending.  Upon arrival patient borderline febrile to 100.2 F and tachycardic.  Normal O2 saturation on room air.  Abdomen soft, nondistended with diffuse tenderness most significant in lower quadrants.  CT abdomen ordered to rule out evidence of appendicitis, diverticulitis, or other etiologies of abdominal pain.  IV Toradol and Zofran given.  Will hold on IV fluids due to IV fluid shortage. If Ct abdomen negative, may need pelvic US.  Lower suspicion for ectopic pregnancy given negative pregnancy test yesterday at urgent care.  CBC unremarkable.  No leukocytosis.  Normal hemoglobin.  CMP significant for hypokalemia 3.4.  Normal renal function.  Slightly elevated total bilirubin 1.6.  Normal LFTs.  Hyponatremia 132.  Pregnancy test negative.  Low suspicion for ectopic pregnancy.  Lipase normal.  Low suspicion for pancreatitis.  2:14 PM reassessed patient at bedside.  Patient admits to improvement in abdominal pain.  CT abdomen personally  reviewed and interpreted which is negative for any acute abnormalities.  No evidence of appendicitis. Pelvic US ordered. Tylenol given for headache.  Offered pelvic exam however, patient declined. Lower suspicion for PID given no concerns for STIs and patient denies any vaginal discharge.   Patient  handed off to [facility], PA-C at shift change pending pelvic US and UA and reassessment.   Co morbidities that complicate the patient evaluation  Hx STIs and ectopic pregnancy  Social Determinants of Health:  No PCP on file       Final Clinical Impression(s) / ED Diagnoses Final diagnoses:  Generalized abdominal pain    Rx / DC Orders ED Discharge Orders          Ordered    ondansetron (ZOFRAN-ODT) 4 MG disintegrating tablet  Every 8 hours PRN        06/02/23 1506              Mannie Stabile, PA-C 06/02/23 1534    Cathren Laine, MD 06/02/23 [phone]

## 2023-06-02 NOTE — Discharge Instructions (Addendum)
Thank you for allowing [place] to be a part of your care today.  Your workup is overall reassuring.  You did have evidence of dehydration.  Zofran, a medication to help with nausea and vomiting, has been sent to the pharmacy.   Increase your fluid intake over the next several days.  I recommend taking Tylenol and/or ibuprofen as needed for fevers, headaches, body aches, etc.  You can alternate [phone] mg of Tylenol with 400-600 mg of ibuprofen every 4 hours as needed.  DO NOT exceed 4000 mg of Tylenol or 3200 mg of ibuprofen in a 24-hour period.  Return to the ED if you develop sudden worsening of your symptoms or if you have new concerns.

## 2023-06-02 NOTE — ED Provider Notes (Signed)
Accepted handoff at shift change from [facility], [place]. Please see prior provider note for more detail.   Briefly: Patient is 22 y.o. who presented to the ED complaining of lower abdominal pain associated with nausea, vomiting, and fever for the past 4 days.  She also admits to cough and sore throat.  DDX: concern for gastroenteritis, acute pelvic abnormality   Plan: follow up on pelvic US, UA      Results for orders placed or performed during the hospital encounter of 06/02/23  CBC with Differential  Result Value Ref Range   WBC 8.1 4.0 - 10.5 K/uL   RBC 4.20 3.87 - 5.11 MIL/uL   Hemoglobin 12.8 12.0 - 15.0 g/dL   HCT 96.0 45.4 - 09.8 %   MCV 92.6 80.0 - 100.0 fL   MCH 30.5 26.0 - 34.0 pg   MCHC 32.9 30.0 - 36.0 g/dL   RDW 11.9 14.7 - 82.9 %   Platelets 252 150 - 400 K/uL   nRBC 0.0 0.0 - 0.2 %   Neutrophils Relative % 77 %   Neutro Abs 6.1 1.7 - 7.7 K/uL   Lymphocytes Relative 14 %   Lymphs Abs 1.2 0.7 - 4.0 K/uL   Monocytes Relative 9 %   Monocytes Absolute 0.7 0.1 - 1.0 K/uL   Eosinophils Relative 0 %   Eosinophils Absolute 0.0 0.0 - 0.5 K/uL   Basophils Relative 0 %   Basophils Absolute 0.0 0.0 - 0.1 K/uL   Immature Granulocytes 0 %   Abs Immature Granulocytes 0.03 0.00 - 0.07 K/uL  Comprehensive metabolic panel  Result Value Ref Range   Sodium 132 (L) 135 - 145 mmol/L   Potassium 3.4 (L) 3.5 - 5.1 mmol/L   Chloride 102 98 - 111 mmol/L   CO2 18 (L) 22 - 32 mmol/L   Glucose, Bld 95 70 - 99 mg/dL   BUN 6 6 - 20 mg/dL   Creatinine, Ser 5.62 0.44 - 1.00 mg/dL   Calcium 8.9 8.9 - 13.0 mg/dL   Total Protein 7.0 6.5 - 8.1 g/dL   Albumin 3.6 3.5 - 5.0 g/dL   AST 21 15 - 41 U/L   ALT 16 0 - 44 U/L   Alkaline Phosphatase 47 38 - 126 U/L   Total Bilirubin 1.6 (H) <1.2 mg/dL   GFR, Estimated >86 >57 mL/min   Anion gap 12 5 - 15  Lipase, blood  Result Value Ref Range   Lipase 19 11 - 51 U/L  hCG, serum, qualitative  Result Value Ref Range   Preg, Serum  NEGATIVE NEGATIVE   CT ABDOMEN PELVIS W CONTRAST  Result Date: 06/02/2023 CLINICAL DATA:  Left lower quadrant pain. EXAM: CT ABDOMEN AND PELVIS WITH CONTRAST TECHNIQUE: Multidetector CT imaging of the abdomen and pelvis was performed using the standard protocol following bolus administration of intravenous contrast. RADIATION DOSE REDUCTION: This exam was performed according to the departmental dose-optimization program which includes automated exposure control, adjustment of the mA and/or kV according to patient size and/or use of iterative reconstruction technique. CONTRAST:  75mL OMNIPAQUE IOHEXOL 350 MG/ML SOLN COMPARISON:  None Available. FINDINGS: Lower chest: The imaged lung bases are clear. The imaged heart is unremarkable. Hepatobiliary: The liver and gallbladder are unremarkable. There is no biliary ductal dilatation. Pancreas: Unremarkable. Spleen: Unremarkable. Adrenals/Urinary Tract: The adrenals are unremarkable. The kidneys are unremarkable, with no focal lesion, stone, hydronephrosis, or hydroureter. The bladder is unremarkable. Stomach/Bowel: The stomach is unremarkable. There is no evidence of bowel  obstruction. There is no abnormal bowel wall thickening or inflammatory change. The tentatively identified appendix is normal. Vascular/Lymphatic: The abdominal aorta is normal in course and caliber. The major branch vessels are patent. The main portal and splenic veins are patent. Incidental note is made of a retroaortic left renal vein. There is no abdominal or pelvic lymphadenopathy. Reproductive: The uterus and adnexa are unremarkable. Other: There is no ascites or free air. Musculoskeletal: There is no acute osseous abnormality or suspicious osseous lesion. There are chronic appearing bilateral pars defects at L4-L5 without resulting anterolisthesis. IMPRESSION: 1. No acute finding in the abdomen or pelvis. 2. 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  Electronically Signed   By: Lesia Hausen M.D.   On: 06/02/2023 14:05   DG Chest Portable 1 View  Result Date: 06/02/2023 CLINICAL DATA:  Fever, cough EXAM: PORTABLE CHEST 1 VIEW COMPARISON:  None Available. FINDINGS: Cardiac and mediastinal contours are within normal limits. No focal pulmonary opacity. No pleural effusion or pneumothorax. No acute osseous abnormality. IMPRESSION: No acute cardiopulmonary process. Electronically Signed   By: Wiliam Ke M.D.   On: 06/02/2023 12:29    Physical Exam  BP (!) 107/59   Pulse 93   Temp 98.6 F (37 C) (Oral)   Resp (!) 23   Ht 5\' 5"  (1.651 m)   Wt 74.8 kg   LMP 05/10/2023 (Approximate)   SpO2 100%   BMI 27.46 kg/m   Physical Exam Vitals and nursing note reviewed.  Constitutional:      General: She is not in acute distress.    Appearance: Normal appearance. She is not ill-appearing or diaphoretic.  Cardiovascular:     Rate and Rhythm: Normal rate and regular rhythm.  Pulmonary:     Effort: Pulmonary effort is normal.  Neurological:     Mental Status: She is alert. Mental status is at baseline.  Psychiatric:        Mood and Affect: Mood normal.        Behavior: Behavior normal.     Procedures  Procedures  ED Course / MDM   Clinical Course as of 06/02/23 1524  Tue Jun 02, 2023  1254 CT ABDOMEN PELVIS W CONTRAST [KJ]    Clinical Course User Index [KJ] Etta Quill   Medical Decision Making Amount and/or Complexity of Data Reviewed Labs: ordered. Radiology: ordered.  Risk OTC drugs. Prescription drug management.   Patient's UA without infection.  It does suggest dehydration.  [place] negative for acute findings.    Patient given PO challenge and was able to tolerate drinking fluids.  She states her pain is returning, will give repeat dose of 15 mg of Toradol prior to discharge.    Record review of urgent care testing yesterday reveals patient is negative for chlamydia, trichomoniasis,  gonorrhea.  Workup is overall reassuring.  Symptoms may be related to viral process.  Advised her to increase her fluid intake over the next several days.  Prescription for Zofran sent to the pharmacy.  The patient has been appropriately medically screened and/or stabilized in the ED. I have low suspicion for any other emergent medical condition which would require further screening, evaluation or treatment in the ED or require inpatient management. At time of discharge the patient is hemodynamically stable and in no acute distress. I have discussed work-up results and diagnosis with patient and answered all questions. Patient is agreeable with discharge plan. We discussed strict return precautions for returning to the emergency department  and they verbalized understanding.          Lenard Simmer, PA-C 06/02/23 1842    Gwyneth Sprout, MD 06/02/23 2337

## 2023-06-02 NOTE — ED Notes (Signed)
Pt states she just urinated while at [place].

## 2023-06-03 LAB — CULTURE, GROUP A STREP (THRC)

## 2023-06-05 ENCOUNTER — Other Ambulatory Visit: Payer: Self-pay | Admitting: Urgent Care

## 2023-06-05 MED ORDER — AMOXICILLIN 875 MG PO TABS: 875.0000 mg | ORAL_TABLET | Freq: Two times a day (BID) | ORAL | 0 refills | Status: DC

## 2023-10-16 ENCOUNTER — Ambulatory Visit

## 2023-10-17 ENCOUNTER — Ambulatory Visit

## 2023-10-21 ENCOUNTER — Ambulatory Visit
Admission: RE | Admit: 2023-10-21 | Discharge: 2023-10-21 | Disposition: A | Discharge status: Home / Self Care | Source: Ambulatory Visit | Attending: Internal Medicine | Admitting: Internal Medicine

## 2023-10-21 VITALS — BP 130/83 | HR 69 | Temp 98.2°F | Resp 16

## 2023-10-21 DIAGNOSIS — R21 Rash and other nonspecific skin eruption: Secondary | ICD-10-CM | POA: Diagnosis not present

## 2023-10-21 DIAGNOSIS — N898 Other specified noninflammatory disorders of vagina: Secondary | ICD-10-CM | POA: Diagnosis not present

## 2023-10-21 DIAGNOSIS — Z113 Encounter for screening for infections with a predominantly sexual mode of transmission: Secondary | ICD-10-CM | POA: Insufficient documentation

## 2023-10-21 DIAGNOSIS — Z3202 Encounter for pregnancy test, result negative: Secondary | ICD-10-CM | POA: Insufficient documentation

## 2023-10-21 LAB — POCT URINE PREGNANCY: Preg Test, Ur: NEGATIVE

## 2023-10-21 MED ORDER — KETOCONAZOLE 2 % EX SHAM: 1.0000 | MEDICATED_SHAMPOO | CUTANEOUS | 0 refills | Status: AC

## 2023-10-21 NOTE — ED Provider Notes (Signed)
 [facility]    CSN: [id number] Arrival date & time: 10/21/23  1748      History   Chief Complaint Chief Complaint  Patient presents with   SEXUALLY TRANSMITTED DISEASE    Entered by patient   Rash    HPI Caitlin King is a 23 y.o. female.   Patient presents with multiple different chief complaints today.  Patient reports itchy rash to the top portion of her scalp that has been present for about a month.  Reports that it seems to be spreading to her forehead.  Denies any changes to lotions, soaps, detergents, shampoos, foods, etc.  Denies any associated fever.  Reports this is the first time she has been evaluated for the symptoms.  She also has itchy skin to left foot but is not sure if this is related.  Patient also requesting STD testing.  Reports that she has more copious amounts of vaginal discharge.  Denies exposure to STD.  Last menstrual cycle was a few days prior but patient reports it only lasted for 2 days.   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   S/P C-section 12/20/2020   Impacted cerumen of left ear 10/04/2020   Gonorrhea affecting pregnancy 05/18/2020   Chlamydia infection during pregnancy 05/18/2020   Ectopic pregnancy of left ovary 02/16/2020   Bleeding in early pregnancy 02/16/2020    Past Surgical History:  Procedure Laterality Date   CESAREAN SECTION N/A 12/20/2020   Procedure: CESAREAN SECTION;  Surgeon: Rande Brunt, MD;  Location: [facility] LD ORS;  Service: Obstetrics;  Laterality: N/A;    OB History     Gravida  3   Para  1   Term  1   Preterm      AB  1   Living  1       SAB      IAB      Ectopic  1   Multiple  0   Live Births  1        Obstetric Comments  G1= ectopic 01/2020, MTX, didn't f/u          Home Medications    Prior to Admission medications   Medication Sig Start Date End Date Taking? Authorizing Provider  ketoconazole (NIZORAL) 2 % shampoo Apply 1 Application topically 2 (two) times a week. Apply 5 to 10 mL to wet scalp, lather, leave on 3 to 5 minutes, and rinse; apply twice weekly for 2 to 4 weeks. 10/22/23  Yes Raquelle Pietro, Rolly Salter E, FNP  mirtazapine (REMERON) 7.5 MG tablet TAKE 1 TABLET BY MOUTH AT BEDTIME. Patient taking differently: Take 7.5 mg by mouth daily. 05/28/23  Yes Nwoko, Stephens Shire E, PA  prazosin (MINIPRESS) 1 MG capsule TAKE 1 CAPSULE BY MOUTH AT BEDTIME. 05/13/23  Yes Nwoko, Uchenna E, PA  amoxicillin (AMOXIL) 875 MG tablet Take 1 tablet (875 mg total) by mouth 2 (two) times daily. 06/05/23   Wallis Bamberg, PA-C  cetirizine (ZYRTEC ALLERGY) 10 MG tablet Take 1 tablet (10 mg total) by mouth daily. Patient not taking: Reported on 06/02/2023 06/01/23   Wallis Bamberg, PA-C  guaifenesin (HUMIBID  E) 400 MG TABS tablet Take 1 tablet 3 times daily as needed for chest congestion and cough Patient not taking: Reported on 06/02/2023 06/04/22   Theadora Rama Scales, PA-C  ibuprofen (ADVIL) 600 MG tablet Take 1 tablet (600 mg total) by mouth every 6 (six) hours as needed. Patient not taking: Reported on 06/02/2023 06/01/23   Wallis Bamberg, PA-C  ondansetron (ZOFRAN-ODT) 4 MG disintegrating tablet Take 1 tablet (4 mg total) by mouth every 8 (eight) hours as needed for nausea or vomiting. 06/02/23   Mannie Stabile, PA-C  promethazine-dextromethorphan (PROMETHAZINE-DM) 6.25-15 MG/5ML syrup Take 5 mLs by mouth 3 (three) times daily as needed for cough. Patient not taking: Reported on 06/02/2023 06/01/23   Wallis Bamberg, PA-C  pseudoephedrine (SUDAFED) 30 MG tablet Take 1 tablet (30 mg total) by mouth every 8 (eight) hours as needed for  congestion. Patient not taking: Reported on 06/02/2023 06/01/23   Wallis Bamberg, PA-C    Family History Family History  Problem Relation Age of Onset   Healthy Mother    Healthy Father    Asthma Brother    Alcohol abuse Neg Hx    Arthritis Neg Hx    Birth defects Neg Hx    Cancer Neg Hx    Depression Neg Hx    COPD Neg Hx    Diabetes Neg Hx    Drug abuse Neg Hx    Early death Neg Hx    Hearing loss Neg Hx    Heart disease Neg Hx    Hyperlipidemia Neg Hx    Hypertension Neg Hx    Learning disabilities Neg Hx    Kidney disease Neg Hx    Mental illness Neg Hx    Mental retardation Neg Hx    Miscarriages / Stillbirths Neg Hx    Stroke Neg Hx    Vision loss Neg Hx    Varicose Veins Neg Hx     Social History Social History   Tobacco Use   Smoking status: Never   Smokeless tobacco: Never  Vaping Use   Vaping status: Never Used  Substance Use Topics   Alcohol use: Not Currently   Drug use: Never     Allergies   Patient has no known allergies.   Review of Systems Review of Systems Per HPI  Physical Exam Triage Vital Signs ED Triage Vitals  Encounter Vitals Group     BP 10/21/23 1843 130/83     Systolic BP Percentile --      Diastolic BP Percentile --      Pulse Rate 10/21/23 1843 69     Resp 10/21/23 1843 16     Temp 10/21/23 1843 98.2 F (36.8 C)     Temp Source 10/21/23 1843 Oral     SpO2 10/21/23 1843 98 %     Weight --      Height --      Head Circumference --      Peak Flow --      Pain Score 10/21/23 1840 0     Pain Loc --      Pain Education --      Exclude from Growth Chart --    No data found.  Updated Vital Signs BP 130/83 (BP Location: Left Arm)   Pulse 69   Temp 98.2 F (36.8 C) (Oral)   Resp 16   LMP 10/18/2023   SpO2 98%   Breastfeeding No   Visual Acuity Right Eye Distance:   Left Eye  Distance:   Bilateral Distance:    Right Eye Near:   Left Eye Near:    Bilateral Near:     Physical Exam Constitutional:       General: She is not in acute distress.    Appearance: Normal appearance. She is not toxic-appearing or diaphoretic.  HENT:     Head: Normocephalic and atraumatic.      Comments: Patient has scaly, flaky rash present at hairline at front of head. Eyes:     Extraocular Movements: Extraocular movements intact.     Conjunctiva/sclera: Conjunctivae normal.  Pulmonary:     Effort: Pulmonary effort is normal.  Genitourinary:    Comments: Deferred with shared decision making.  Self swab performed. Feet:     Comments: Patient has dry skin noted throughout bottom of left foot.  No discoloration or swelling. Neurological:     General: No focal deficit present.     Mental Status: She is alert and oriented to person, place, and time. Mental status is at baseline.  Psychiatric:        Mood and Affect: Mood normal.        Behavior: Behavior normal.        Thought Content: Thought content normal.        Judgment: Judgment normal.      UC Treatments / Results  Labs (all labs ordered are listed, but only abnormal results are displayed) Labs Reviewed  POCT URINE PREGNANCY - Normal  CERVICOVAGINAL ANCILLARY ONLY    EKG   Radiology No results found.  Procedures Procedures (including critical care time)  Medications Ordered in UC Medications - No data to display  Initial Impression / Assessment and Plan / UC Course  I have reviewed the triage vital signs and the nursing notes.  Pertinent labs & imaging results that were available during my care of the patient were reviewed by me and considered in my medical decision making (see chart for details).     1.  Rash  Rash to scalp appears to be consistent with possible seborrheic dermatitis.  Will treat with topical ketoconazole.  Rash to foot appears to be dryness.  Advised supportive care related to this and follow-up with podiatry.  Advised follow-up with dermatology if rash persists or worsens despite medication today.  2.  STD  testing  Vaginal swab pending.  Urine pregnancy test negative.  Awaiting results for vaginal swab prior to treatment.  Advised strict follow-up precautions.  Patient verbalized understanding and was agreeable with plan. Final Clinical Impressions(s) / UC Diagnoses   Final diagnoses:  Urine pregnancy test negative  Vaginal discharge  Screening examination for venereal disease  Rash and nonspecific skin eruption     Discharge Instructions      I have prescribed you topical shampoo for your hair.  Follow-up with dermatology if rash persists or worsens.  Follow-up with podiatry for foot symptoms.  STD testing pending.  Will call if it is positive.    ED Prescriptions     Medication Sig Dispense Auth. Provider   ketoconazole (NIZORAL) 2 % shampoo Apply 1 Application topically 2 (two) times a week. Apply 5 to 10 mL to wet scalp, lather, leave on 3 to 5 minutes, and rinse; apply twice weekly for 2 to 4 weeks. 120 mL Gustavus Bryant, [place]      PDMP not reviewed this encounter.   Gustavus Bryant, [place] 10/21/23 [phone]

## 2023-10-21 NOTE — ED Triage Notes (Signed)
 Pt is wanting STD testing. Pt denies symptoms. Pt does state she has excessive discharge.   Pt also has a rash on her forehead that she states is spreading across her forehead x 1 mo.

## 2023-10-21 NOTE — Discharge Instructions (Signed)
 I have prescribed you topical shampoo for your hair.  Follow-up with dermatology if rash persists or worsens.  Follow-up with podiatry for foot symptoms.  STD testing pending.  Will call if it is positive.

## 2023-10-22 ENCOUNTER — Telehealth (HOSPITAL_COMMUNITY): Payer: Self-pay

## 2023-10-22 LAB — CERVICOVAGINAL ANCILLARY ONLY
Bacterial Vaginitis (gardnerella): POSITIVE — AB
Candida Glabrata: NEGATIVE
Candida Vaginitis: NEGATIVE
Chlamydia: NEGATIVE
Comment: NEGATIVE
Comment: NEGATIVE
Comment: NEGATIVE
Comment: NEGATIVE
Comment: NEGATIVE
Comment: NORMAL
Neisseria Gonorrhea: NEGATIVE
Trichomonas: NEGATIVE

## 2023-10-22 MED ORDER — METRONIDAZOLE 500 MG PO TABS: 500.0000 mg | ORAL_TABLET | Freq: Two times a day (BID) | ORAL | 0 refills | Status: AC

## 2023-10-22 NOTE — Telephone Encounter (Signed)
 Per protocol, pt requires tx with metronidazole. Rx sent to pharmacy on file.

## 2023-12-28 ENCOUNTER — Ambulatory Visit

## 2024-02-20 ENCOUNTER — Ambulatory Visit (HOSPITAL_COMMUNITY)

## 2024-02-22 ENCOUNTER — Ambulatory Visit

## 2024-02-24 ENCOUNTER — Inpatient Hospital Stay (HOSPITAL_COMMUNITY)
Admission: AD | Admit: 2024-02-24 | Discharge: 2024-02-24 | Disposition: A | Discharge status: Home / Self Care | Attending: Family Medicine | Admitting: Family Medicine

## 2024-02-24 ENCOUNTER — Other Ambulatory Visit: Payer: Self-pay

## 2024-02-24 DIAGNOSIS — Z3202 Encounter for pregnancy test, result negative: Secondary | ICD-10-CM | POA: Diagnosis not present

## 2024-02-24 DIAGNOSIS — N939 Abnormal uterine and vaginal bleeding, unspecified: Secondary | ICD-10-CM | POA: Insufficient documentation

## 2024-02-24 LAB — HCG, QUANTITATIVE, PREGNANCY: hCG, Beta Chain, Quant, S: <1 m[IU]/mL (ref <5)

## 2024-02-24 LAB — POCT PREGNANCY, URINE: Preg Test, Ur: NEGATIVE

## 2024-02-24 NOTE — MAU Provider Note (Signed)
 None     S [name] is a 23 y.o. G10P1011 female at [facility] who presents to MAU today with complaint of spotting that started yesterday. Bleeding has become heavier today. Reports two positive home pregnancy tests at the end of June, LMP 01/28/2024.  Pertinent items noted in HPI and remainder of comprehensive ROS otherwise negative.   O BP 111/73 (BP Location: Right Arm)   Pulse 72   Temp 99 F (37.2 C) (Oral)   Resp 19   Ht 5' 5 (1.651 m)   Wt 73.5 kg   LMP 01/28/2024   SpO2 100%   BMI 26.97 kg/m  Physical Exam Vitals reviewed.  Constitutional:      General: She is not in acute distress.    Appearance: She is well-developed. She is not diaphoretic.  Eyes:     General: No scleral icterus. Pulmonary:     Effort: Pulmonary effort is normal. No respiratory distress.  Skin:    General: Skin is warm and dry.  Neurological:     Mental Status: She is alert.     Coordination: Coordination normal.    Results for orders placed or performed during the hospital encounter of 02/24/24 (from the past 24 hours)  Pregnancy, urine POC     Status: None   Collection Time: 02/24/24  6:52 PM  Result Value Ref Range   Preg Test, Ur NEGATIVE NEGATIVE  hCG, quantitative, pregnancy     Status: None   Collection Time: 02/24/24  7:33 PM  Result Value Ref Range   hCG, Beta Chain, Quant, S <1 <5 mIU/mL    MDM:  MAU Course: -Rule out ectopic pregnancy, SAB -Negative UPT, will check serum hCG. -hCG <1, bleeding likely period.  A 1. Negative pregnancy test (Primary) - Discharge patient  2. Vaginal bleeding - Discharge patient   Medical screening exam complete  P Discharge from MAU in stable condition with return precautions  No future appointments. Allergies as of 02/24/2024   No Known Allergies      Medication List     STOP taking these medications    amoxicillin  875 MG tablet Commonly known as: AMOXIL        TAKE these medications    cetirizine  10 MG  tablet Commonly known as: ZyrTEC  Allergy Take 1 tablet (10 mg total) by mouth daily.   guaifenesin  400 MG Tabs tablet Commonly known as: HUMIBID E Take 1 tablet 3 times daily as needed for chest congestion and cough   ibuprofen  600 MG tablet Commonly known as: ADVIL  Take 1 tablet (600 mg total) by mouth every 6 (six) hours as needed.   ketoconazole  2 % shampoo Commonly known as: NIZORAL  Apply 1 Application topically 2 (two) times a week. Apply 5 to 10 mL to wet scalp, lather, leave on 3 to 5 minutes, and rinse; apply twice weekly for 2 to 4 weeks.   mirtazapine  7.5 MG tablet Commonly known as: REMERON  TAKE 1 TABLET BY MOUTH AT BEDTIME. What changed: when to take this   ondansetron  4 MG disintegrating tablet Commonly known as: ZOFRAN -ODT Take 1 tablet (4 mg total) by mouth every 8 (eight) hours as needed for nausea or vomiting.   prazosin  1 MG capsule Commonly known as: MINIPRESS  TAKE 1 CAPSULE BY MOUTH AT BEDTIME.   promethazine -dextromethorphan 6.25-15 MG/5ML syrup Commonly known as: PROMETHAZINE -DM Take 5 mLs by mouth 3 (three) times daily as needed for cough.   pseudoephedrine  30 MG tablet Commonly known as: SUDAFED Take 1 tablet (  30 mg total) by mouth every 8 (eight) hours as needed for congestion.        Joesph DELENA Sear, PA

## 2024-02-24 NOTE — MAU Note (Signed)
 Caitlin King is a 23 y.o. at Unknown here in MAU reporting: she had two +HPT on 01/23/2024 and began spotting yesterday.  Reports she's continuing to spot with wiping today but it's heavier.  Last intercourse was yesterday. Also reports having lower back and abdominal pain, states pain in abdomen isn't not cramping but feels like a pulling/tugging.  Reports back pain is an intermittent aching.  Reports took Ibuprofen  last night for discomfort.  LMP: 01/28/2024 Onset of complaint: yesterday Pain score: 7 Vitals:   02/24/24 1846  BP: 111/73  Pulse: 72  Resp: 19  Temp: 99 F (37.2 C)  SpO2: 100%     FHT: NA  Lab orders placed from triage: UPT

## 2024-05-05 ENCOUNTER — Other Ambulatory Visit (HOSPITAL_COMMUNITY): Payer: Self-pay | Admitting: Physician Assistant

## 2024-05-05 DIAGNOSIS — F331 Major depressive disorder, recurrent, moderate: Secondary | ICD-10-CM

## 2024-05-05 DIAGNOSIS — F4310 Post-traumatic stress disorder, unspecified: Secondary | ICD-10-CM

## 2024-05-05 DIAGNOSIS — F411 Generalized anxiety disorder: Secondary | ICD-10-CM

## 2024-06-12 DIAGNOSIS — R1084 Generalized abdominal pain: Secondary | ICD-10-CM | POA: Diagnosis not present

## 2024-06-12 DIAGNOSIS — R109 Unspecified abdominal pain: Secondary | ICD-10-CM | POA: Diagnosis not present

## 2024-06-12 DIAGNOSIS — R110 Nausea: Secondary | ICD-10-CM | POA: Diagnosis not present

## 2024-06-12 DIAGNOSIS — N3001 Acute cystitis with hematuria: Secondary | ICD-10-CM | POA: Diagnosis not present

## 2024-07-12 DIAGNOSIS — Z124 Encounter for screening for malignant neoplasm of cervix: Secondary | ICD-10-CM | POA: Diagnosis not present

## 2024-07-12 DIAGNOSIS — Z0000 Encounter for general adult medical examination without abnormal findings: Secondary | ICD-10-CM | POA: Diagnosis not present

## 2024-07-12 DIAGNOSIS — Z113 Encounter for screening for infections with a predominantly sexual mode of transmission: Secondary | ICD-10-CM | POA: Diagnosis not present

## 2024-07-13 ENCOUNTER — Other Ambulatory Visit: Payer: Self-pay | Admitting: Obstetrics

## 2024-07-13 DIAGNOSIS — N6311 Unspecified lump in the right breast, upper outer quadrant: Secondary | ICD-10-CM

## 2024-08-17 ENCOUNTER — Other Ambulatory Visit

## 2024-08-30 ENCOUNTER — Other Ambulatory Visit

## 2024-09-15 ENCOUNTER — Other Ambulatory Visit
